# Patient Record
Sex: Female | Born: 1965 | Hispanic: No
Health system: Southern US, Community
[De-identification: ages and names within clinical notes are randomized; demographics above are authoritative.]

## PROBLEM LIST (undated history)

## (undated) ENCOUNTER — Emergency Department (HOSPITAL_COMMUNITY): Admission: EM | Payer: Self-pay | Source: Home / Self Care

## (undated) DIAGNOSIS — I2510 Atherosclerotic heart disease of native coronary artery without angina pectoris: Secondary | ICD-10-CM

## (undated) DIAGNOSIS — C539 Malignant neoplasm of cervix uteri, unspecified: Secondary | ICD-10-CM

## (undated) DIAGNOSIS — I10 Essential (primary) hypertension: Secondary | ICD-10-CM

## (undated) DIAGNOSIS — J40 Bronchitis, not specified as acute or chronic: Secondary | ICD-10-CM

## (undated) DIAGNOSIS — E119 Type 2 diabetes mellitus without complications: Secondary | ICD-10-CM

## (undated) HISTORY — DX: Atherosclerotic heart disease of native coronary artery without angina pectoris: I25.10

## (undated) HISTORY — PX: OVARY SURGERY: SHX727

---

## 2001-09-23 ENCOUNTER — Emergency Department (HOSPITAL_COMMUNITY): Admission: EM | Admit: 2001-09-23 | Discharge: 2001-09-23 | Payer: Self-pay | Admitting: Internal Medicine

## 2001-10-26 ENCOUNTER — Emergency Department (HOSPITAL_COMMUNITY): Admission: EM | Admit: 2001-10-26 | Discharge: 2001-10-26 | Payer: Self-pay | Admitting: Emergency Medicine

## 2008-08-03 ENCOUNTER — Emergency Department (HOSPITAL_COMMUNITY): Admission: EM | Admit: 2008-08-03 | Discharge: 2008-08-04 | Payer: Self-pay | Admitting: Emergency Medicine

## 2010-08-23 LAB — URINE CULTURE
Colony Count: NO GROWTH
Culture: NO GROWTH

## 2010-08-23 LAB — GC/CHLAMYDIA PROBE AMP, GENITAL
Chlamydia, DNA Probe: NEGATIVE
GC Probe Amp, Genital: NEGATIVE

## 2010-08-23 LAB — URINALYSIS, ROUTINE W REFLEX MICROSCOPIC
Ketones, ur: NEGATIVE mg/dL
Nitrite: NEGATIVE
Protein, ur: NEGATIVE mg/dL

## 2010-08-23 LAB — PREGNANCY, URINE: Preg Test, Ur: NEGATIVE

## 2011-04-25 NOTE — ED Provider Notes (Signed)
°

## 2011-04-25 NOTE — ED Notes (Signed)
°

## 2013-03-17 ENCOUNTER — Encounter (HOSPITAL_COMMUNITY): Payer: Self-pay | Admitting: Emergency Medicine

## 2013-03-17 ENCOUNTER — Emergency Department (HOSPITAL_COMMUNITY)
Admission: EM | Admit: 2013-03-17 | Discharge: 2013-03-17 | Disposition: A | Discharge status: Home / Self Care | Payer: Self-pay | Attending: Emergency Medicine | Admitting: Emergency Medicine

## 2013-03-17 DIAGNOSIS — M79672 Pain in left foot: Secondary | ICD-10-CM

## 2013-03-17 DIAGNOSIS — Z8614 Personal history of Methicillin resistant Staphylococcus aureus infection: Secondary | ICD-10-CM | POA: Insufficient documentation

## 2013-03-17 DIAGNOSIS — F17200 Nicotine dependence, unspecified, uncomplicated: Secondary | ICD-10-CM | POA: Insufficient documentation

## 2013-03-17 DIAGNOSIS — N764 Abscess of vulva: Secondary | ICD-10-CM | POA: Insufficient documentation

## 2013-03-17 DIAGNOSIS — M79609 Pain in unspecified limb: Secondary | ICD-10-CM | POA: Insufficient documentation

## 2013-03-17 MED ORDER — LIDOCAINE HCL (PF) 1 % IJ SOLN
5.0000 mL | Freq: Once | INTRAMUSCULAR | Status: AC
  Administered 2013-03-17: 5 mL via INTRADERMAL
  Filled 2013-03-17: qty 5

## 2013-03-17 MED ORDER — HYDROCODONE-ACETAMINOPHEN 5-325 MG PO TABS: ORAL_TABLET | ORAL | Status: DC

## 2013-03-17 MED ORDER — OXYCODONE-ACETAMINOPHEN 5-325 MG PO TABS
1.0000 | ORAL_TABLET | Freq: Once | ORAL | Status: AC
  Administered 2013-03-17: 1 via ORAL
  Filled 2013-03-17: qty 1

## 2013-03-17 MED ORDER — SULFAMETHOXAZOLE-TRIMETHOPRIM 800-160 MG PO TABS: 1.0000 | ORAL_TABLET | Freq: Two times a day (BID) | ORAL | Status: DC

## 2013-03-17 NOTE — ED Provider Notes (Signed)
Medical screening examination/treatment/procedure(s) were performed by non-physician practitioner and as supervising physician I was immediately available for consultation/collaboration.  EKG Interpretation   None         Charles B. Bernette Mayers, MD 03/17/13 [phone]

## 2013-03-17 NOTE — ED Provider Notes (Signed)
CSN: [id number]     Arrival date & time 03/17/13  1346 History   First MD Initiated Contact with Patient 03/17/13 1358     Chief Complaint  Patient presents with  . Wound Infection  . Foot Pain   (Consider location/radiation/quality/duration/timing/severity/associated sxs/prior Treatment) Patient is a 47 y.o. female presenting with lower extremity pain and abscess. The history is provided by the patient.  Foot Pain This is a new problem. Episode onset: 2 weeks. The problem occurs constantly. The problem has been unchanged. Associated symptoms include arthralgias. Pertinent negatives include no abdominal pain, chills, fever, joint swelling, nausea, neck pain, numbness, rash, vomiting or weakness. Associated symptoms comments: Left foot. The symptoms are aggravated by walking. She has tried nothing for the symptoms. The treatment provided no relief.  Abscess Location:  Ano-genital Ano-genital abscess location:  Vagina Size:  3 Abscess quality: fluctuance, induration and painful   Abscess quality: not draining, no redness and not weeping   Red streaking: no   Duration:  4 days Progression:  Worsening Pain details:    Quality:  Throbbing   Severity:  Moderate   Timing:  Constant   Progression:  Worsening Chronicity:  Recurrent Context: not diabetes   Context comment:  Hx of MRSA Relieved by:  Nothing Worsened by:  Nothing tried Ineffective treatments:  None tried Associated symptoms: no fever, no nausea and no vomiting   Risk factors: hx of MRSA and prior abscess     History reviewed. No pertinent past medical history. History reviewed. No pertinent past surgical history. No family history on file. History  Substance Use Topics  . Smoking status: Current Every Day Smoker    Types: Cigarettes  . Smokeless tobacco: Not on file  . Alcohol Use: Yes     Comment: occ   OB History   Grav Para Term Preterm Abortions TAB SAB Ect Mult Living                 Review of Systems   Constitutional: Negative for fever and chills.  Gastrointestinal: Negative for nausea, vomiting and abdominal pain.  Musculoskeletal: Positive for arthralgias. Negative for joint swelling and neck pain.       Left foot pain  Skin: Positive for color change. Negative for rash.       Abscess of the labia  Neurological: Negative for weakness and numbness.  Hematological: Negative for adenopathy.  All other systems reviewed and are negative.    Allergies  Review of patient's allergies indicates no known allergies.  Home Medications   Current Outpatient Rx  Name  Route  Sig  Dispense  Refill  . diphenhydramine-acetaminophen (TYLENOL PM) 25-500 MG TABS   Oral   Take 1 tablet by mouth at bedtime as needed (sleep).         . ranitidine (ZANTAC) 150 MG tablet   Oral   Take 150 mg by mouth daily as needed for heartburn.          BP 144/80  Pulse 103  Temp(Src) 98.4 F (36.9 C) (Oral)  Resp 17  Ht 5' (1.524 m)  Wt 218 lb (98.884 kg)  BMI 42.58 kg/m2  SpO2 100%  LMP 03/12/2013 Physical Exam  Nursing note and vitals reviewed. Constitutional: She is oriented to person, place, and time. She appears well-developed and well-nourished. No distress.  HENT:  Head: Normocephalic and atraumatic.  Cardiovascular: Normal rate, regular rhythm and normal heart sounds.   No murmur heard. Pulmonary/Chest: Effort normal and breath sounds normal.  No respiratory distress.  Musculoskeletal: She exhibits tenderness.  Localized ttp of the dorsal left foot.  No edema, erythema or bruising.  Pt has full ROM of the left foot and ankle.  No proximal tenderness.  NV intact  Neurological: She is alert and oriented to person, place, and time. She exhibits normal muscle tone. Coordination normal.  Skin: Skin is warm and dry. There is erythema.  Abscess to the right labia.  Moderate induration and fluctuance.  No drainage. No abscess of the Bartholin's gland    ED Course  Procedures (including  critical care time) Labs Review Labs Reviewed - No data to display Imaging Review No results found.  EKG Interpretation   None       MDM    INCISION AND DRAINAGE Performed by: Pauline Aus L. Consent: Verbal consent obtained. Risks and benefits: risks, benefits and alternatives were discussed Type: abscess  Body area: right labia  Anesthesia: local infiltration  Incision was made with a #11 scalpel.  Local anesthetic: lidocaine 1 % w/o epinephrine  Anesthetic total: 3 ml  Complexity: complex Blunt dissection to break up loculations  Drainage: purulent  Drainage amount: moderate  Packing material: 1/4 in iodoform gauze  Patient tolerance: Patient tolerated the procedure well with no immediate complications.     Patient is non-toxic appearing.  Ambulates with steady gait.  Agrees to warm wet compresses, Bactrim, #12 Vicodin for pain. Packing removal in 2 days. Agrees to return here if needed. Patient appears stable for discharge.  Junaid Wurzer L. Trisha Mangle, PA-C 03/17/13 1610

## 2013-03-17 NOTE — ED Notes (Signed)
1. Pt reports ?abcess to vaginal area since Saturday, no fever, area is not draining, no nausea or vomiting.  2. Left foot pain for 2 weeks, denies any known injury, "really hurts to walk" Drinking tea in triage.

## 2013-07-31 ENCOUNTER — Emergency Department: Payer: Self-pay | Admitting: Internal Medicine

## 2014-11-30 ENCOUNTER — Emergency Department (HOSPITAL_COMMUNITY)
Admission: EM | Admit: 2014-11-30 | Discharge: 2014-11-30 | Disposition: A | Discharge status: Home / Self Care | Payer: Self-pay | Attending: Emergency Medicine | Admitting: Emergency Medicine

## 2014-11-30 ENCOUNTER — Emergency Department (HOSPITAL_COMMUNITY): Payer: Self-pay

## 2014-11-30 ENCOUNTER — Encounter (HOSPITAL_COMMUNITY): Payer: Self-pay | Admitting: Emergency Medicine

## 2014-11-30 DIAGNOSIS — Y9289 Other specified places as the place of occurrence of the external cause: Secondary | ICD-10-CM | POA: Insufficient documentation

## 2014-11-30 DIAGNOSIS — S39012A Strain of muscle, fascia and tendon of lower back, initial encounter: Secondary | ICD-10-CM | POA: Insufficient documentation

## 2014-11-30 DIAGNOSIS — Y998 Other external cause status: Secondary | ICD-10-CM | POA: Insufficient documentation

## 2014-11-30 DIAGNOSIS — W228XXA Striking against or struck by other objects, initial encounter: Secondary | ICD-10-CM | POA: Insufficient documentation

## 2014-11-30 DIAGNOSIS — Z720 Tobacco use: Secondary | ICD-10-CM | POA: Insufficient documentation

## 2014-11-30 DIAGNOSIS — S92911A Unspecified fracture of right toe(s), initial encounter for closed fracture: Secondary | ICD-10-CM

## 2014-11-30 DIAGNOSIS — Z792 Long term (current) use of antibiotics: Secondary | ICD-10-CM | POA: Insufficient documentation

## 2014-11-30 DIAGNOSIS — S92511A Displaced fracture of proximal phalanx of right lesser toe(s), initial encounter for closed fracture: Secondary | ICD-10-CM | POA: Insufficient documentation

## 2014-11-30 DIAGNOSIS — Y9311 Activity, swimming: Secondary | ICD-10-CM | POA: Insufficient documentation

## 2014-11-30 MED ORDER — CYCLOBENZAPRINE HCL 10 MG PO TABS: 10.0000 mg | ORAL_TABLET | Freq: Three times a day (TID) | ORAL | Status: DC | PRN

## 2014-11-30 MED ORDER — IBUPROFEN 800 MG PO TABS: 800.0000 mg | ORAL_TABLET | Freq: Three times a day (TID) | ORAL | Status: DC

## 2014-11-30 MED ORDER — TRAMADOL HCL 50 MG PO TABS: 50.0000 mg | ORAL_TABLET | Freq: Four times a day (QID) | ORAL | Status: DC | PRN

## 2014-11-30 NOTE — ED Notes (Signed)
Pt states that she has chronic back pain and this morning twisted at work and now it is hurting worse.  Also c/o right 2nd toe pain x 1 month.

## 2014-11-30 NOTE — ED Notes (Signed)
Patient with no complaints at this time. Respirations even and unlabored. Skin warm/dry. Discharge instructions reviewed with patient at this time. Patient given opportunity to voice concerns/ask questions. Patient discharged at this time and left Emergency Department with steady gait.   

## 2014-11-30 NOTE — ED Notes (Signed)
PA Tammy Triplett at bedside.

## 2014-11-30 NOTE — ED Provider Notes (Signed)
CSN: [id number]     Arrival date & time 11/30/14  6295 History   First MD Initiated Contact with Patient 11/30/14 [phone]     Chief Complaint  Patient presents with  . Back Pain     (Consider location/radiation/quality/duration/timing/severity/associated sxs/prior Treatment) HPI   Laura Frost is a 49 y.o. female who presents to the Emergency Department complaining of sudden onset of right sided low back pain that began this morning while at work.  She reports twisting to her left and felt a "pulling" sensation to her right back.  She states this is a reoccurring problem.  Pain to her back is worse with movement and improves with rest.  She has not taken any medications prior to arrival.  She denies numbness, pain or weakness to the lower extremities, urine or bowel changes or abdominal pain.    She also request evaluation of pain and swelling to her right second toe.  She reports a direct blow to her toe one month ago while swimming. She states she continues to have swelling to her toe and pain with weight bearing.  She denies pain or swelling proximal to the toe.     History reviewed. No pertinent past medical history. History reviewed. No pertinent past surgical history. History reviewed. No pertinent family history. History  Substance Use Topics  . Smoking status: Current Every Day Smoker    Types: Cigarettes  . Smokeless tobacco: Not on file  . Alcohol Use: Yes     Comment: occ   OB History    No data available     Review of Systems  Constitutional: Negative for fever.  Respiratory: Negative for shortness of breath.   Gastrointestinal: Negative for vomiting, abdominal pain and constipation.  Genitourinary: Negative for dysuria, hematuria, flank pain, decreased urine volume and difficulty urinating.  Musculoskeletal: Positive for back pain and arthralgias (right second toe pain and swelling). Negative for joint swelling.  Skin: Negative for rash and wound.  Neurological:  Negative for weakness and numbness.  All other systems reviewed and are negative.     Allergies  Review of patient's allergies indicates no known allergies.  Home Medications   Prior to Admission medications   Medication Sig Start Date End Date Taking? Authorizing Provider  diphenhydramine-acetaminophen (TYLENOL PM) 25-500 MG TABS Take 1 tablet by mouth at bedtime as needed (sleep).    Historical Provider, MD  HYDROcodone-acetaminophen (NORCO/VICODIN) 5-325 MG per tablet Take one-two tabs po q 4-6 hrs prn pain 03/17/13   Donte Lenzo, PA-C  ranitidine (ZANTAC) 150 MG tablet Take 150 mg by mouth daily as needed for heartburn.    Historical Provider, MD  sulfamethoxazole-trimethoprim (SEPTRA DS) 800-160 MG per tablet Take 1 tablet by mouth 2 (two) times daily. 03/17/13   Alejos Reinhardt, PA-C   BP 151/82 mmHg  Pulse 80  Temp(Src) 98.1 F (36.7 C) (Oral)  Resp 18  Ht 5' (1.524 m)  Wt 215 lb (97.523 kg)  BMI 41.99 kg/m2  SpO2 100%  LMP 11/17/2014 Physical Exam  Constitutional: She is oriented to person, place, and time. She appears well-developed and well-nourished. No distress.  HENT:  Head: Normocephalic and atraumatic.  Neck: Normal range of motion. Neck supple.  Cardiovascular: Normal rate, regular rhythm, normal heart sounds and intact distal pulses.   No murmur heard. Pulmonary/Chest: Effort normal and breath sounds normal. No respiratory distress.  Abdominal: Soft. She exhibits no distension. There is no tenderness.  Musculoskeletal: She exhibits tenderness. She exhibits no edema.  Lumbar back: She exhibits tenderness and pain. She exhibits normal range of motion, no swelling, no deformity, no laceration and normal pulse.  ttp of the right lumbar paraspinal muscles.  No spinal tenderness.  DP pulses are brisk and symmetrical.  Distal sensation intact.  Hip Flexors/Extensors are intact.  Pt has 5/5 strength against resistance of bilateral lower extremities.  ttp at the  base of the right second toe.  Moderate edema.  No discoloration, distal sensation intact.     Neurological: She is alert and oriented to person, place, and time. She has normal strength. No sensory deficit. She exhibits normal muscle tone. Coordination and gait normal.  Reflex Scores:      Patellar reflexes are 2+ on the right side and 2+ on the left side.      Achilles reflexes are 2+ on the right side and 2+ on the left side. Skin: Skin is warm and dry. No rash noted.  Nursing note and vitals reviewed.   ED Course  Procedures (including critical care time) Labs Review Labs Reviewed - No data to display  Imaging Review Dg Toe 2nd Right  11/30/2014   CLINICAL DATA:  49 year old female with a history of second right toe pain. Trauma occurred 1-2 months ago.  EXAM: RIGTH SECOND TOE  COMPARISON:  None.  FINDINGS: Fracture site of the proximal phalanx of the second toe with callus formation and incomplete healing. No significant displacement. No additional fractures identified.  IMPRESSION: Fracture site of the proximal phalanx of the second right toe, with callus formation and incomplete healing. No displacement.  Signed,  Dulcy Fanny. Earleen Newport, DO  [facility]  [facility]   Electronically Signed   By: Corrie Mckusick D.O.   On: 11/30/2014 10:32     EKG Interpretation None      MDM   Final diagnoses:  Lumbar strain, initial encounter  Toe fracture, right, closed, initial encounter    Back pain is likely muscular.  Pt is ambulatory.  No concerning sx's for emergent neurological or infectious process.  Pt has a subacute fx of toe.  Remains NV intact.  Toe buddy taped , she agrees to f/u with ortho    Kem Parkinson, PA-C 12/01/14 2123  Jola Schmidt, MD 12/05/14 [phone]

## 2014-11-30 NOTE — Discharge Instructions (Signed)
Buddy Taping of Toes We have taped your toes together to keep them from moving. This is called "buddy taping" since we used a part of your own body to keep the injured part still. We placed soft padding between your toes to keep them from rubbing against each other. Buddy taping will help with healing and to reduce pain. Keep your toes buddy taped together for as long as directed by your caregiver. HOME CARE INSTRUCTIONS   Raise your injured area above the level of your heart while sitting or lying down. Prop it up with pillows.  An ice pack used every twenty minutes, while awake, for the first one to two days may be helpful. Put ice in a plastic bag and put a towel between the bag and your skin.  Watch for signs that the taping is too tight. These signs may be:  Numbness of your taped toes.  Coolness of your taped toes.  Color change in the area beyond the tape.  Increased pain.  If you have any of these signs, loosen or rewrap the tape. If you need to loosen or rewrap the buddy tape, make sure you use the padding again. SEEK IMMEDIATE MEDICAL CARE IF:   You have worse pain, swelling, inflammation (soreness), drainage or bleeding after you rewrap the tape.  Any new problems occur. MAKE SURE YOU:   Understand these instructions.  Will watch your condition.  Will get help right away if you are not doing well or get worse. Document Released: 02/01/2004 Document Revised: 07/22/2011 Document Reviewed: 04/26/2008 [facility] Patient Information 2015 [place], [place]. This information is not intended to replace advice given to you by your health care provider. Make sure you discuss any questions you have with your health care provider.  Lumbosacral Strain Lumbosacral strain is a strain of any of the parts that make up your lumbosacral vertebrae. Your lumbosacral vertebrae are the bones that make up the lower third of your backbone. Your lumbosacral vertebrae are held together by muscles and  tough, fibrous tissue (ligaments).  CAUSES  A sudden blow to your back can cause lumbosacral strain. Also, anything that causes an excessive stretch of the muscles in the low back can cause this strain. This is typically seen when people exert themselves strenuously, fall, lift heavy objects, bend, or crouch repeatedly. RISK FACTORS  Physically demanding work.  Participation in pushing or pulling sports or sports that require a sudden twist of the back (tennis, golf, baseball).  Weight lifting.  Excessive lower back curvature.  Forward-tilted pelvis.  Weak back or abdominal muscles or both.  Tight hamstrings. SIGNS AND SYMPTOMS  Lumbosacral strain may cause pain in the area of your injury or pain that moves (radiates) down your leg.  DIAGNOSIS Your health care provider can often diagnose lumbosacral strain through a physical exam. In some cases, you may need tests such as X-ray exams.  TREATMENT  Treatment for your lower back injury depends on many factors that your clinician will have to evaluate. However, most treatment will include the use of anti-inflammatory medicines. HOME CARE INSTRUCTIONS   Avoid hard physical activities (tennis, racquetball, waterskiing) if you are not in proper physical condition for it. This may aggravate or create problems.  If you have a back problem, avoid sports requiring sudden body movements. Swimming and walking are generally safer activities.  Maintain good posture.  Maintain a healthy weight.  For acute conditions, you may put ice on the injured area.  Put ice in a plastic  bag.  Place a towel between your skin and the bag.  Leave the ice on for 20 minutes, 2-3 times a day.  When the low back starts healing, stretching and strengthening exercises may be recommended. SEEK MEDICAL CARE IF:  Your back pain is getting worse.  You experience severe back pain not relieved with medicines. SEEK IMMEDIATE MEDICAL CARE IF:   You have  numbness, tingling, weakness, or problems with the use of your arms or legs.  There is a change in bowel or bladder control.  You have increasing pain in any area of the body, including your belly (abdomen).  You notice shortness of breath, dizziness, or feel faint.  You feel sick to your stomach (nauseous), are throwing up (vomiting), or become sweaty.  You notice discoloration of your toes or legs, or your feet get very cold. MAKE SURE YOU:   Understand these instructions.  Will watch your condition.  Will get help right away if you are not doing well or get worse. Document Released: 02/06/2005 Document Revised: 05/04/2013 Document Reviewed: 12/16/2012 [facility] Patient Information 2015 [place], [place]. This information is not intended to replace advice given to you by your health care provider. Make sure you discuss any questions you have with your health care provider.

## 2016-02-05 ENCOUNTER — Emergency Department (HOSPITAL_COMMUNITY)
Admission: EM | Admit: 2016-02-05 | Discharge: 2016-02-05 | Disposition: A | Discharge status: Home / Self Care | Payer: Self-pay | Attending: Emergency Medicine | Admitting: Emergency Medicine

## 2016-02-05 ENCOUNTER — Emergency Department (HOSPITAL_COMMUNITY): Payer: Self-pay

## 2016-02-05 ENCOUNTER — Encounter (HOSPITAL_COMMUNITY): Payer: Self-pay | Admitting: Emergency Medicine

## 2016-02-05 DIAGNOSIS — J069 Acute upper respiratory infection, unspecified: Secondary | ICD-10-CM | POA: Insufficient documentation

## 2016-02-05 DIAGNOSIS — Z79899 Other long term (current) drug therapy: Secondary | ICD-10-CM | POA: Insufficient documentation

## 2016-02-05 DIAGNOSIS — F17210 Nicotine dependence, cigarettes, uncomplicated: Secondary | ICD-10-CM | POA: Insufficient documentation

## 2016-02-05 DIAGNOSIS — Z791 Long term (current) use of non-steroidal anti-inflammatories (NSAID): Secondary | ICD-10-CM | POA: Insufficient documentation

## 2016-02-05 MED ORDER — PREDNISONE 20 MG PO TABS: 40.0000 mg | ORAL_TABLET | Freq: Every day | ORAL | 0 refills | Status: DC

## 2016-02-05 MED ORDER — GUAIFENESIN-CODEINE 100-10 MG/5ML PO SYRP: 10.0000 mL | ORAL_SOLUTION | Freq: Three times a day (TID) | ORAL | 0 refills | Status: DC | PRN

## 2016-02-05 MED ORDER — PREDNISONE 50 MG PO TABS
60.0000 mg | ORAL_TABLET | Freq: Once | ORAL | Status: AC
  Administered 2016-02-05: 60 mg via ORAL
  Filled 2016-02-05: qty 1

## 2016-02-05 MED ORDER — ALBUTEROL SULFATE HFA 108 (90 BASE) MCG/ACT IN AERS
2.0000 | INHALATION_SPRAY | Freq: Once | RESPIRATORY_TRACT | Status: AC
  Administered 2016-02-05: 2 via RESPIRATORY_TRACT
  Filled 2016-02-05: qty 6.7

## 2016-02-05 MED ORDER — GUAIFENESIN-CODEINE 100-10 MG/5ML PO SOLN
10.0000 mL | Freq: Once | ORAL | Status: AC
  Administered 2016-02-05: 10 mL via ORAL
  Filled 2016-02-05: qty 10

## 2016-02-05 NOTE — ED Triage Notes (Signed)
PT c/o right ear pressure, nasal congestion and cough x1 week unrelieved by home robitussin medication. PT denies any fevers or SOB.

## 2016-02-05 NOTE — ED Provider Notes (Signed)
[facility] DEPT Provider Note   CSN: [id number] Arrival date & time: 02/05/16  [id number]     History   Chief Complaint Chief Complaint  Patient presents with  . Cough    HPI Laura Frost is a 50 y.o. female.  HPI  Laura Frost is a 50 y.o. female who presents to the Emergency Department complaining of Persistent nonproductive cough for one week. She also reports associated nasal congestion, sore throat and drainage. She states the cough is worse when lying supine.  She does report recent sick contacts. She's been using over-the-counter Robitussin with some relief. She denies any fevers, chills, shortness of breath, and chest pain. Patient does continue to smoke.   History reviewed. No pertinent past medical history.  There are no active problems to display for this patient.   History reviewed. No pertinent surgical history.  OB History    No data available       Home Medications    Prior to Admission medications   Medication Sig Start Date End Date Taking? Authorizing Provider  cyclobenzaprine (FLEXERIL) 10 MG tablet Take 1 tablet (10 mg total) by mouth 3 (three) times daily as needed. 11/30/14   Zhanna Melin, PA-C  diphenhydramine-acetaminophen (TYLENOL PM) 25-500 MG TABS Take 1 tablet by mouth at bedtime as needed (sleep).    Historical Provider, MD  HYDROcodone-acetaminophen (NORCO/VICODIN) 5-325 MG per tablet Take one-two tabs po q 4-6 hrs prn pain 03/17/13   Kaidyn Hernandes, PA-C  ibuprofen (ADVIL,MOTRIN) 800 MG tablet Take 1 tablet (800 mg total) by mouth 3 (three) times daily. 11/30/14   Kellsey Sansone, PA-C  ranitidine (ZANTAC) 150 MG tablet Take 150 mg by mouth daily as needed for heartburn.    Historical Provider, MD  sulfamethoxazole-trimethoprim (SEPTRA DS) 800-160 MG per tablet Take 1 tablet by mouth 2 (two) times daily. 03/17/13   Kanani Mowbray, PA-C  traMADol (ULTRAM) 50 MG tablet Take 1 tablet (50 mg total) by mouth every 6 (six) hours as needed. 11/30/14    Elizar Alpern, PA-C    Family History History reviewed. No pertinent family history.  Social History Social History  Substance Use Topics  . Smoking status: Current Every Day Smoker    Types: Cigarettes  . Smokeless tobacco: Never Used  . Alcohol use Yes     Comment: occ     Allergies   Review of patient's allergies indicates no known allergies.   Review of Systems Review of Systems  Constitutional: Negative for activity change, appetite change, chills and fever.  HENT: Positive for congestion, rhinorrhea and sore throat. Negative for facial swelling and trouble swallowing.   Eyes: Negative for visual disturbance.  Respiratory: Positive for cough and wheezing. Negative for shortness of breath and stridor.   Cardiovascular: Negative for chest pain.  Gastrointestinal: Negative for abdominal pain, nausea and vomiting.  Musculoskeletal: Negative for neck pain and neck stiffness.  Skin: Negative.   Neurological: Negative for dizziness, weakness, numbness and headaches.  Hematological: Negative for adenopathy.  Psychiatric/Behavioral: Negative for confusion.  All other systems reviewed and are negative.    Physical Exam Updated Vital Signs BP 166/97 (BP Location: Left Arm)   Pulse 86   Temp 98 F (36.7 C) (Oral)   Resp 22   Ht 5' (1.524 m)   Wt 77.1 kg   LMP 01/29/2016   SpO2 98%   BMI 33.20 kg/m   Physical Exam  Constitutional: She is oriented to person, place, and time. She appears well-developed and well-nourished. No distress.  HENT:  Head: Normocephalic and atraumatic.  Right Ear: Tympanic membrane and ear canal normal.  Left Ear: Tympanic membrane and ear canal normal.  Mouth/Throat: Uvula is midline and mucous membranes are normal. Posterior oropharyngeal erythema present. No oropharyngeal exudate, posterior oropharyngeal edema or tonsillar abscesses.  Eyes: EOM are normal. Pupils are equal, round, and reactive to light.  Neck: Normal range of motion,  full passive range of motion without pain and phonation normal. Neck supple.  Cardiovascular: Normal rate, regular rhythm and intact distal pulses.   No murmur heard. Pulmonary/Chest: Effort normal. No stridor. No respiratory distress. She has wheezes. She has no rales. She exhibits no tenderness.  Few scattered expiratory wheezes on exam. No rales or rhonchi.  Abdominal: Soft. She exhibits no distension. There is no tenderness.  Musculoskeletal: She exhibits no edema.  Lymphadenopathy:    She has no cervical adenopathy.  Neurological: She is alert and oriented to person, place, and time. She exhibits normal muscle tone. Coordination normal.  Skin: Skin is warm and dry.  Nursing note and vitals reviewed.    ED Treatments / Results  Labs (all labs ordered are listed, but only abnormal results are displayed) Labs Reviewed - No data to display  EKG  EKG Interpretation None       Radiology Dg Chest 2 View  Result Date: 02/05/2016 CLINICAL DATA:  50 year old female with cough for 2 weeks, initially productive but now nonproductive. Initial encounter. Smoker. EXAM: CHEST  2 VIEW COMPARISON:  None. FINDINGS: Lung volumes are within normal limits. Normal cardiac size and mediastinal contours. Visualized tracheal air column is within normal limits. Mild diffuse increased interstitial markings. No pneumothorax, pulmonary edema, pleural effusion or confluent pulmonary opacity. No acute osseous abnormality identified. Mild dextro convex scoliotic curvature of the thoracic spine. IMPRESSION: Mildly increased bilateral pulmonary interstitial markings likely related to smoking. Viral/atypical respiratory infection would be difficult to exclude. Otherwise no acute cardiopulmonary abnormality. Electronically Signed   By: Genevie Ann M.D.   On: 02/05/2016 09:24    Procedures Procedures (including critical care time)  Medications Ordered in ED Medications  albuterol (PROVENTIL HFA;VENTOLIN HFA) 108  (90 Base) MCG/ACT inhaler 2 puff (not administered)  predniSONE (DELTASONE) tablet 60 mg (not administered)  guaiFENesin-codeine 100-10 MG/5ML solution 10 mL (not administered)     Initial Impression / Assessment and Plan / ED Course  I have reviewed the triage vital signs and the nursing notes.  Pertinent labs & imaging results that were available during my care of the patient were reviewed by me and considered in my medical decision making (see chart for details).  Clinical Course    Patient well appearing. Vital signs are stable. No hypoxia or tachypnea. Symptoms are likely related to a viral URI.  Albuterol inhaler dispensed. Patient agrees to symptomatic treatment and follow-up with her PCP if needed.  Final Clinical Impressions(s) / ED Diagnoses   Final diagnoses:  URI (upper respiratory infection)    New Prescriptions New Prescriptions   No medications on file     Kem Parkinson, PA-C 02/05/16 [place], MD 02/10/16 1446

## 2016-02-05 NOTE — Discharge Instructions (Signed)
2 puffs of the inhaler 4 times a day as needed. Drink plenty of fluids. Tylenol if needed for fever. Follow-up with your doctor or return to the ER for any worsening symptoms.

## 2016-02-14 ENCOUNTER — Emergency Department (HOSPITAL_COMMUNITY)
Admission: EM | Admit: 2016-02-14 | Discharge: 2016-02-14 | Disposition: A | Discharge status: Home / Self Care | Payer: Self-pay | Attending: Emergency Medicine | Admitting: Emergency Medicine

## 2016-02-14 ENCOUNTER — Encounter (HOSPITAL_COMMUNITY): Payer: Self-pay | Admitting: Emergency Medicine

## 2016-02-14 DIAGNOSIS — Z79899 Other long term (current) drug therapy: Secondary | ICD-10-CM | POA: Insufficient documentation

## 2016-02-14 DIAGNOSIS — Z792 Long term (current) use of antibiotics: Secondary | ICD-10-CM | POA: Insufficient documentation

## 2016-02-14 DIAGNOSIS — H66001 Acute suppurative otitis media without spontaneous rupture of ear drum, right ear: Secondary | ICD-10-CM | POA: Insufficient documentation

## 2016-02-14 DIAGNOSIS — F17210 Nicotine dependence, cigarettes, uncomplicated: Secondary | ICD-10-CM | POA: Insufficient documentation

## 2016-02-14 MED ORDER — AMOXICILLIN 500 MG PO CAPS: 500.0000 mg | ORAL_CAPSULE | Freq: Two times a day (BID) | ORAL | 0 refills | Status: DC

## 2016-02-14 NOTE — ED Notes (Signed)
Pt made aware to return if symptoms worsen or if any life threatening symptoms occur.   

## 2016-02-14 NOTE — ED Triage Notes (Signed)
Pt reports right ear pain x 2 weeks. Pt states she thinks she may have a q-tip swab stuck in right ear.

## 2016-02-14 NOTE — ED Provider Notes (Signed)
[facility] DEPT Provider Note   CSN: [id number] Arrival date & time: 02/14/16  1219     History   Chief Complaint Chief Complaint  Patient presents with  . Foreign Body in [place] is a 50 y.o. female.  HPI  She feels that her ear is "stopped up" for the past two weeks. She has used Q tips in her ears but is uncertain if any parts were left behind. No drainage. No otalgia. No fevers or chills. She had a respiratory infection 3 weeks ago that is resolving - had chest congestion with that.   History reviewed. No pertinent past medical history.  History reviewed. No pertinent surgical history.   Home Medications    Prior to Admission medications   Medication Sig Start Date End Date Taking? Authorizing Provider  albuterol (PROVENTIL HFA;VENTOLIN HFA) 108 (90 Base) MCG/ACT inhaler Inhale 1-2 puffs into the lungs every 6 (six) hours as needed for wheezing or shortness of breath.   Yes Historical Provider, MD  amoxicillin (AMOXIL) 500 MG capsule Take 1 capsule (500 mg total) by mouth 2 (two) times daily. 02/14/16   Milagros Loll, MD    Family History History reviewed. No pertinent family history.  Social History Social History  Substance Use Topics  . Smoking status: Current Every Day Smoker    Types: Cigarettes  . Smokeless tobacco: Never Used  . Alcohol use Yes     Comment: occ     Allergies   Asa [aspirin]   Review of Systems Review of Systems Constitutional: no fevers/chills Eyes: no vision changes Ears, nose, mouth, throat, and face: +cough productive of white phlegm Respiratory: no shortness of breath Cardiovascular: no chest pain Gastrointestinal: no nausea/vomiting, no abdominal pain, no constipation, no diarrhea Genitourinary: no dysuria, no hematuria Integument: no rash Hematologic/lymphatic: no bleeding/bruising, no edema Musculoskeletal: no arthralgias, no myalgias Neurological: no paresthesias, no weakness   Physical  Exam Updated Vital Signs BP 146/88   Pulse 73   Temp 97.8 F (36.6 C) (Oral)   Resp 16   Ht 5' (1.524 m)   Wt 79.8 kg   LMP 01/29/2016   SpO2 95%   BMI 34.37 kg/m   Physical Exam General Apperance: NAD Head: Normocephalic, atraumatic Eyes: PERRL, EOMI, anicteric sclera Ears: Normal external ear canal, R TM with white discoloration and mild bulging and erythema Nose: Nares normal, septum midline, mucosa normal Throat: Lips, mucosa and tongue normal  Neck: Supple, trachea midline Back: No tenderness or bony abnormality  Lungs: Clear to auscultation bilaterally. No wheezes, rhonchi or rales. Breathing comfortably Chest Wall: Nontender, no deformity Heart: Regular rate and rhythm, no murmur/rub/gallop Abdomen: Soft, nontender, nondistended, no rebound/guarding Extremities: Normal, atraumatic, warm and well perfused, no edema Pulses: 2+ throughout Skin: No rashes or lesions Neurologic: Alert and oriented x 3. CNII-XII intact. Normal strength and sensation   ED Treatments / Results  Procedures   Medications Ordered in ED Medications - No data to display   Initial Impression / Assessment and Plan / ED Course  I have reviewed the triage vital signs and the nursing notes.  Pertinent labs & imaging results that were available during my care of the patient were reviewed by me and considered in my medical decision making (see chart for details).  Clinical Course  12:50pm Evaluated and no foreign body seen in R ear canal.   Final Clinical Impressions(s) / ED Diagnoses   Final diagnoses:  Acute suppurative otitis media of right  ear without spontaneous rupture of tympanic membrane, recurrence not specified  Exam consistent with acute otitis media of the R ear. Will treat with amoxicillin 500mg  BID for 5 day course. Return precautions discussed. Follow up with PCP in 1-2 weeks.   New Prescriptions New Prescriptions   AMOXICILLIN (AMOXIL) 500 MG CAPSULE    Take 1 capsule (500  mg total) by mouth 2 (two) times daily.     Milagros Loll, MD 02/14/16 [place], MD 02/14/16 [phone]

## 2016-02-16 ENCOUNTER — Encounter (HOSPITAL_COMMUNITY): Payer: Self-pay | Admitting: Emergency Medicine

## 2016-02-16 ENCOUNTER — Emergency Department (HOSPITAL_COMMUNITY): Payer: Self-pay

## 2016-02-16 ENCOUNTER — Emergency Department (HOSPITAL_COMMUNITY)
Admission: EM | Admit: 2016-02-16 | Discharge: 2016-02-16 | Disposition: A | Discharge status: Home / Self Care | Payer: Self-pay | Attending: Emergency Medicine | Admitting: Emergency Medicine

## 2016-02-16 DIAGNOSIS — Y9301 Activity, walking, marching and hiking: Secondary | ICD-10-CM | POA: Insufficient documentation

## 2016-02-16 DIAGNOSIS — Z792 Long term (current) use of antibiotics: Secondary | ICD-10-CM | POA: Insufficient documentation

## 2016-02-16 DIAGNOSIS — Y999 Unspecified external cause status: Secondary | ICD-10-CM | POA: Insufficient documentation

## 2016-02-16 DIAGNOSIS — S93402A Sprain of unspecified ligament of left ankle, initial encounter: Secondary | ICD-10-CM | POA: Insufficient documentation

## 2016-02-16 DIAGNOSIS — W109XXA Fall (on) (from) unspecified stairs and steps, initial encounter: Secondary | ICD-10-CM | POA: Insufficient documentation

## 2016-02-16 DIAGNOSIS — Z79899 Other long term (current) drug therapy: Secondary | ICD-10-CM | POA: Insufficient documentation

## 2016-02-16 DIAGNOSIS — Y929 Unspecified place or not applicable: Secondary | ICD-10-CM | POA: Insufficient documentation

## 2016-02-16 DIAGNOSIS — F17210 Nicotine dependence, cigarettes, uncomplicated: Secondary | ICD-10-CM | POA: Insufficient documentation

## 2016-02-16 HISTORY — DX: Bronchitis, not specified as acute or chronic: J40

## 2016-02-16 MED ORDER — OXYCODONE-ACETAMINOPHEN 5-325 MG PO TABS
1.0000 | ORAL_TABLET | Freq: Once | ORAL | Status: AC
  Administered 2016-02-16: 1 via ORAL
  Filled 2016-02-16: qty 1

## 2016-02-16 MED ORDER — IBUPROFEN 400 MG PO TABS
600.0000 mg | ORAL_TABLET | Freq: Once | ORAL | Status: AC
  Administered 2016-02-16: 600 mg via ORAL
  Filled 2016-02-16: qty 2

## 2016-02-16 NOTE — ED Notes (Signed)
EDP at bedside  

## 2016-02-16 NOTE — ED Provider Notes (Signed)
[facility] DEPT Provider Note   CSN: [id number] Arrival date & time: 02/16/16  1001  By signing my name below, I, Laura Frost, attest that this documentation has been prepared under the direction and in the presence of Laura Manifold, MD . Electronically Signed: Higinio Frost, Scribe. 02/16/2016. 10:59 AM.  History   Chief Complaint Chief Complaint  Patient presents with  . Ankle Injury   The history is provided by the patient. No language interpreter was used.   HPI Comments: Laura Frost is a 50 y.o. female who presents to the Emergency Department complaining of left ankle pain s/p a fall that occurred this morning. Pt reports she was walking up the steps this morning when she slipped and "rolled" her ankle. She states associated pain when ambulating and left ankle swelling. She notes she has not taken any medication to relieve her pain. Pt denies pain to any other area.    Past Medical History:  Diagnosis Date  . Bronchitis    There are no active problems to display for this patient.  History reviewed. No pertinent surgical history.  OB History    Gravida Para Term Preterm AB Living   1         1   SAB TAB Ectopic Multiple Live Births                 Home Medications    Prior to Admission medications   Medication Sig Start Date End Date Taking? Authorizing Provider  albuterol (PROVENTIL HFA;VENTOLIN HFA) 108 (90 Base) MCG/ACT inhaler Inhale 1-2 puffs into the lungs every 6 (six) hours as needed for wheezing or shortness of breath.    Historical Provider, MD  amoxicillin (AMOXIL) 500 MG capsule Take 1 capsule (500 mg total) by mouth 2 (two) times daily. 02/14/16   Milagros Loll, MD    Family History History reviewed. No pertinent family history.  Social History Social History  Substance Use Topics  . Smoking status: Current Every Day Smoker    Packs/day: 0.50    Types: Cigarettes  . Smokeless tobacco: Never Used  . Alcohol use Yes     Comment: occ      Allergies   Asa [aspirin]   Review of Systems Review of Systems  Constitutional: Negative for fever.  Musculoskeletal: Positive for arthralgias.   Physical Exam Updated Vital Signs BP 130/81 (BP Location: Left Arm)   Pulse 100   Temp 98.8 F (37.1 C) (Oral)   Resp 18   Ht 5' (1.524 m)   Wt 176 lb (79.8 kg)   LMP 01/29/2016   SpO2 94%   BMI 34.37 kg/m   Physical Exam  Constitutional: She is oriented to person, place, and time. She appears well-developed and well-nourished. No distress.  HENT:  Head: Normocephalic and atraumatic.  Eyes: EOM are normal.  Neck: Normal range of motion.  Cardiovascular: Normal rate, regular rhythm and normal heart sounds.   Pulmonary/Chest: Effort normal and breath sounds normal.  Abdominal: Soft. She exhibits no distension. There is no tenderness.  Musculoskeletal: Normal range of motion. She exhibits tenderness.  tenderness and swelling noted to the lateral malleolus closed injury  Neurological: She is alert and oriented to person, place, and time.  Neurovascularly intact   Skin: Skin is warm and dry.  Psychiatric: She has a normal mood and affect. Judgment normal.  Nursing note and vitals reviewed.  ED Treatments / Results  Labs (all labs ordered are listed, but only abnormal results are displayed)  Labs Reviewed - No data to display  EKG  EKG Interpretation None       Radiology Dg Ankle Complete Left  Result Date: 02/16/2016 CLINICAL DATA:  Twisting injury of the left ankle. Occurred last night. EXAM: LEFT ANKLE COMPLETE - 3+ VIEW COMPARISON:  None. FINDINGS: There is no evidence of fracture, dislocation, or joint effusion. There is no evidence of arthropathy or other focal bone abnormality. Severe ir soft tissue swelling over the lateral malleolus. IMPRESSION: No acute osseous injury of the left ankle. Electronically Signed   By: Kathreen Devoid   On: 02/16/2016 10:42    Procedures Procedures (including critical care  time)  Medications Ordered in ED Medications - No data to display  DIAGNOSTIC STUDIES:  Oxygen Saturation is 94% on RA, normal by my interpretation.    COORDINATION OF CARE:  11:43 AM Discussed treatment Frost with pt at bedside and pt agreed to Frost.  Initial Impression / Assessment and Frost / ED Course  I have reviewed the triage vital signs and the nursing notes.  Pertinent labs & imaging results that were available during my care of the patient were reviewed by me and considered in my medical decision making (see chart for details).  Clinical Course    Ankle sprain. NVI. Crutches. RICE. PRN nsaids.   I personally performed the services described in this documentation, which was scribed in my presence. The recorded information has been reviewed and is accurate.   Final Clinical Impressions(s) / ED Diagnoses   Final diagnoses:  Sprain of left ankle, unspecified ligament, initial encounter    New Prescriptions New Prescriptions   No medications on file     Laura Manifold, MD 02/19/16 1542

## 2016-02-16 NOTE — ED Triage Notes (Signed)
PT states "I twisted my left ankle walking down 3 steps last night at 10pm". PT states pain with ambulation and swelling to out ankle noted.

## 2016-10-18 ENCOUNTER — Encounter (HOSPITAL_COMMUNITY): Payer: Self-pay | Admitting: Emergency Medicine

## 2016-10-18 ENCOUNTER — Emergency Department (HOSPITAL_COMMUNITY)
Admission: EM | Admit: 2016-10-18 | Discharge: 2016-10-18 | Disposition: A | Discharge status: Home / Self Care | Payer: Self-pay | Attending: Emergency Medicine | Admitting: Emergency Medicine

## 2016-10-18 DIAGNOSIS — F17210 Nicotine dependence, cigarettes, uncomplicated: Secondary | ICD-10-CM | POA: Insufficient documentation

## 2016-10-18 DIAGNOSIS — R112 Nausea with vomiting, unspecified: Secondary | ICD-10-CM | POA: Insufficient documentation

## 2016-10-18 DIAGNOSIS — R109 Unspecified abdominal pain: Secondary | ICD-10-CM | POA: Insufficient documentation

## 2016-10-18 DIAGNOSIS — R197 Diarrhea, unspecified: Secondary | ICD-10-CM | POA: Insufficient documentation

## 2016-10-18 DIAGNOSIS — Z5321 Procedure and treatment not carried out due to patient leaving prior to being seen by health care provider: Secondary | ICD-10-CM | POA: Insufficient documentation

## 2016-10-18 HISTORY — DX: Malignant neoplasm of cervix uteri, unspecified: C53.9

## 2016-10-18 MED ORDER — ONDANSETRON 4 MG PO TBDP
4.0000 mg | ORAL_TABLET | Freq: Once | ORAL | Status: AC
  Administered 2016-10-18: 4 mg via ORAL
  Filled 2016-10-18: qty 1

## 2016-10-18 NOTE — ED Notes (Signed)
Not in [place] during rounding

## 2016-10-18 NOTE — ED Notes (Signed)
Pt not in [place] for lab draw- per registration, pt left

## 2016-10-18 NOTE — ED Triage Notes (Signed)
Pt c/o some kind of possible bites to arms x 1 week. Round red bumps noted. C/o n/v/d since yesterday. Pt anxious and restless.

## 2016-10-18 NOTE — ED Triage Notes (Signed)
Pt complains of Chigger bites as well as N/V- since this am  Followed by [facility] Department,ent

## 2016-10-19 ENCOUNTER — Encounter (HOSPITAL_COMMUNITY): Payer: Self-pay | Admitting: Emergency Medicine

## 2016-10-19 ENCOUNTER — Emergency Department (HOSPITAL_COMMUNITY)
Admission: EM | Admit: 2016-10-19 | Discharge: 2016-10-19 | Disposition: A | Discharge status: Home / Self Care | Payer: Self-pay | Attending: Emergency Medicine | Admitting: Emergency Medicine

## 2016-10-19 DIAGNOSIS — S40862A Insect bite (nonvenomous) of left upper arm, initial encounter: Secondary | ICD-10-CM | POA: Insufficient documentation

## 2016-10-19 DIAGNOSIS — W57XXXA Bitten or stung by nonvenomous insect and other nonvenomous arthropods, initial encounter: Secondary | ICD-10-CM | POA: Insufficient documentation

## 2016-10-19 DIAGNOSIS — Y93H2 Activity, gardening and landscaping: Secondary | ICD-10-CM | POA: Insufficient documentation

## 2016-10-19 DIAGNOSIS — Y92096 Garden or yard of other non-institutional residence as the place of occurrence of the external cause: Secondary | ICD-10-CM | POA: Insufficient documentation

## 2016-10-19 DIAGNOSIS — R1110 Vomiting, unspecified: Secondary | ICD-10-CM | POA: Insufficient documentation

## 2016-10-19 DIAGNOSIS — R197 Diarrhea, unspecified: Secondary | ICD-10-CM | POA: Insufficient documentation

## 2016-10-19 DIAGNOSIS — Z886 Allergy status to analgesic agent status: Secondary | ICD-10-CM | POA: Insufficient documentation

## 2016-10-19 DIAGNOSIS — F17210 Nicotine dependence, cigarettes, uncomplicated: Secondary | ICD-10-CM | POA: Insufficient documentation

## 2016-10-19 DIAGNOSIS — Z8541 Personal history of malignant neoplasm of cervix uteri: Secondary | ICD-10-CM | POA: Insufficient documentation

## 2016-10-19 DIAGNOSIS — Y998 Other external cause status: Secondary | ICD-10-CM | POA: Insufficient documentation

## 2016-10-19 DIAGNOSIS — S80862A Insect bite (nonvenomous), left lower leg, initial encounter: Secondary | ICD-10-CM | POA: Insufficient documentation

## 2016-10-19 MED ORDER — ONDANSETRON 4 MG PO TBDP: 4.0000 mg | ORAL_TABLET | Freq: Three times a day (TID) | ORAL | 0 refills | Status: DC | PRN

## 2016-10-19 MED ORDER — TRIAMCINOLONE ACETONIDE 0.025 % EX OINT: 1.0000 "application " | TOPICAL_OINTMENT | Freq: Two times a day (BID) | CUTANEOUS | 1 refills | Status: DC

## 2016-10-19 MED ORDER — ONDANSETRON 4 MG PO TBDP
4.0000 mg | ORAL_TABLET | Freq: Once | ORAL | Status: AC
  Administered 2016-10-19: 4 mg via ORAL
  Filled 2016-10-19: qty 1

## 2016-10-19 NOTE — ED Provider Notes (Signed)
[facility] DEPT Provider Note   CSN: [id number] Arrival date & time: 10/19/16  1013     History   Chief Complaint Chief Complaint  Patient presents with  . Abdominal Pain  . Insect Bite    HPI Laura Frost is a 51 y.o. female.  HPI  The patient is a 51 year old female, she has a known history of no significant past medical history, she presents after having some insect bites to her arm after mowing the yard. She felt as though she was having chigger bites, they have been itchy, she has been putting bleach on her skin, this has not really helped that much. She also complains of 24 hours of nausea vomiting and diarrhea. She had 3 episodes of vomiting yesterday, none today but she is still nauseated. She also had multiple loose stools in the last 24 hours, she feels like things are gradually improving. No fevers today but she was told she had a fever yesterday. She has a mild amount of lower abdominal cramping, no urinary symptoms, no blood in the stools, no swelling of the legs, no headaches today. She was drinking lots of Pedialyte over the last 24 hours trying to hydrate  Past Medical History:  Diagnosis Date  . Bronchitis   . Cervical cancer ([place])     There are no active problems to display for this patient.   History reviewed. No pertinent surgical history.  OB History    Gravida Para Term Preterm AB Living   1         1   SAB TAB Ectopic Multiple Live Births                   Home Medications    Prior to Admission medications   Medication Sig Start Date End Date Taking? Authorizing Provider  albuterol (PROVENTIL HFA;VENTOLIN HFA) 108 (90 Base) MCG/ACT inhaler Inhale 1-2 puffs into the lungs every 6 (six) hours as needed for wheezing or shortness of breath.   Yes [provider]  lisinopril (PRINIVIL,ZESTRIL) 10 MG tablet Take 10 mg by mouth daily.   Yes [provider]  amoxicillin (AMOXIL) 500 MG capsule Take 1 capsule (500 mg total) by mouth  2 (two) times daily. Patient not taking: Reported on 10/19/2016 02/14/16   Milagros Loll, MD  guaifenesin (ROBITUSSIN) 100 MG/5ML syrup Take 200 mg by mouth 3 (three) times daily as needed for cough.    [provider]  ondansetron (ZOFRAN ODT) 4 MG disintegrating tablet Take 1 tablet (4 mg total) by mouth every 8 (eight) hours as needed for nausea. 10/19/16   Noemi Chapel, MD  triamcinolone (KENALOG) 0.025 % ointment Apply 1 application topically 2 (two) times daily. Do not apply to face 10/19/16   Noemi Chapel, MD    Family History No family history on file.  Social History Social History  Substance Use Topics  . Smoking status: Current Every Day Smoker    Packs/day: 0.50    Types: Cigarettes  . Smokeless tobacco: Never Used  . Alcohol use Yes     Comment: occ     Allergies   Asa [aspirin]   Review of Systems Review of Systems  All other systems reviewed and are negative.    Physical Exam Updated Vital Signs There were no vitals taken for this visit.  Physical Exam  Constitutional: She appears well-developed and well-nourished. No distress.  HENT:  Head: Normocephalic and atraumatic.  Mouth/Throat: Oropharynx is clear and moist. No oropharyngeal  exudate.  Eyes: Conjunctivae and EOM are normal. Pupils are equal, round, and reactive to light. Right eye exhibits no discharge. Left eye exhibits no discharge. No scleral icterus.  Neck: Normal range of motion. Neck supple. No JVD present. No thyromegaly present.  Cardiovascular: Normal rate, regular rhythm, normal heart sounds and intact distal pulses.  Exam reveals no gallop and no friction rub.   No murmur heard. Pulmonary/Chest: Effort normal and breath sounds normal. No respiratory distress. She has no wheezes. She has no rales.  Abdominal: Soft. Bowel sounds are normal. She exhibits no distension and no mass. There is no tenderness.  Musculoskeletal: Normal range of motion. She exhibits no edema or tenderness.    Lymphadenopathy:    She has no cervical adenopathy.  Neurological: She is alert. Coordination normal.  Skin: Skin is warm and dry. Rash noted. No erythema.  Multiple small bites to the LUE and the LLE.  No pustules / vesicles / petechia - purpura.    Psychiatric: She has a normal mood and affect. Her behavior is normal.  Nursing note and vitals reviewed.    ED Treatments / Results  Labs (all labs ordered are listed, but only abnormal results are displayed) Labs Reviewed - No data to display   Radiology No results found.  Procedures Procedures (including critical care time)  Medications Ordered in ED Medications  ondansetron (ZOFRAN-ODT) disintegrating tablet 4 mg (not administered)     Initial Impression / Assessment and Plan / ED Course  I have reviewed the triage vital signs and the nursing notes.  Pertinent labs & imaging results that were available during my care of the patient were reviewed by me and considered in my medical decision making (see chart for details).     Mild chigger bites - Trimacinalone cream Zofran for n/v/d - she declined IVF Well appaering otherwse. Pt agreeable to reutrn as needed  Final Clinical Impressions(s) / ED Diagnoses   Final diagnoses:  Insect bite, initial encounter  Vomiting and diarrhea    New Prescriptions New Prescriptions   ONDANSETRON (ZOFRAN ODT) 4 MG DISINTEGRATING TABLET    Take 1 tablet (4 mg total) by mouth every 8 (eight) hours as needed for nausea.   TRIAMCINOLONE (KENALOG) 0.025 % OINTMENT    Apply 1 application topically 2 (two) times daily. Do not apply to face     Noemi Chapel, MD 10/19/16 [phone]

## 2016-10-19 NOTE — Discharge Instructions (Signed)
Zofran for nausea Drink plenty of fluids Use the steroid cream on your skin twice daily for 10 days The itching will likely continue for a week or 2. ER for worsening symptoms.  Please obtain all of your results from medical records or have your doctors office obtain the results - share them with your doctor - you should be seen at your doctors office in the next 2 days. Call today to arrange your follow up. Take the medications as prescribed. Please review all of the medicines and only take them if you do not have an allergy to them. Please be aware that if you are taking birth control pills, taking other prescriptions, ESPECIALLY ANTIBIOTICS may make the birth control ineffective - if this is the case, either do not engage in sexual activity or use alternative methods of birth control such as condoms until you have finished the medicine and your family doctor says it is OK to restart them. If you are on a blood thinner such as COUMADIN, be aware that any other medicine that you take may cause the coumadin to either work too much, or not enough - you should have your coumadin level rechecked in next 7 days if this is the case.  ?  It is also a possibility that you have an allergic reaction to any of the medicines that you have been prescribed - Everybody reacts differently to medications and while MOST people have no trouble with most medicines, you may have a reaction such as nausea, vomiting, rash, swelling, shortness of breath. If this is the case, please stop taking the medicine immediately and contact your physician.  ?  You should return to the ER if you develop severe or worsening symptoms.

## 2016-10-19 NOTE — ED Triage Notes (Signed)
Pt reports n/v/d x 2 days. Been drinking Pedialyte still having stomach upset. Pt also had insect bites to bilateral arm and legs

## 2017-04-02 ENCOUNTER — Other Ambulatory Visit: Payer: Self-pay

## 2017-04-02 ENCOUNTER — Emergency Department
Admission: EM | Admit: 2017-04-02 | Discharge: 2017-04-02 | Disposition: A | Discharge status: Home / Self Care | Payer: No Typology Code available for payment source | Attending: Emergency Medicine | Admitting: Emergency Medicine

## 2017-04-02 ENCOUNTER — Encounter: Payer: Self-pay | Admitting: *Deleted

## 2017-04-02 DIAGNOSIS — Y929 Unspecified place or not applicable: Secondary | ICD-10-CM | POA: Insufficient documentation

## 2017-04-02 DIAGNOSIS — Z8541 Personal history of malignant neoplasm of cervix uteri: Secondary | ICD-10-CM | POA: Diagnosis not present

## 2017-04-02 DIAGNOSIS — Y999 Unspecified external cause status: Secondary | ICD-10-CM | POA: Insufficient documentation

## 2017-04-02 DIAGNOSIS — S2001XA Contusion of right breast, initial encounter: Secondary | ICD-10-CM | POA: Insufficient documentation

## 2017-04-02 DIAGNOSIS — S20101A Unspecified superficial injuries of breast, right breast, initial encounter: Secondary | ICD-10-CM | POA: Diagnosis present

## 2017-04-02 DIAGNOSIS — F17210 Nicotine dependence, cigarettes, uncomplicated: Secondary | ICD-10-CM | POA: Insufficient documentation

## 2017-04-02 DIAGNOSIS — Z79899 Other long term (current) drug therapy: Secondary | ICD-10-CM | POA: Diagnosis not present

## 2017-04-02 DIAGNOSIS — Y939 Activity, unspecified: Secondary | ICD-10-CM | POA: Insufficient documentation

## 2017-04-02 MED ORDER — IBUPROFEN 800 MG PO TABS: 800.0000 mg | ORAL_TABLET | Freq: Three times a day (TID) | ORAL | 0 refills | Status: DC | PRN
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
## 2017-04-02 NOTE — ED Notes (Signed)
Pt verbalizes understanding of d/c instructions, medications and follow up 
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
## 2018-03-04 ENCOUNTER — Ambulatory Visit: Payer: Self-pay | Attending: Oncology

## 2019-02-22 ENCOUNTER — Emergency Department (HOSPITAL_COMMUNITY): Payer: Self-pay

## 2019-02-22 ENCOUNTER — Emergency Department (HOSPITAL_COMMUNITY)
Admission: EM | Admit: 2019-02-22 | Discharge: 2019-02-22 | Disposition: A | Discharge status: Home / Self Care | Payer: Self-pay | Attending: Emergency Medicine | Admitting: Emergency Medicine

## 2019-02-22 ENCOUNTER — Other Ambulatory Visit: Payer: Self-pay

## 2019-02-22 ENCOUNTER — Encounter (HOSPITAL_COMMUNITY): Payer: Self-pay | Admitting: Emergency Medicine

## 2019-02-22 DIAGNOSIS — S5011XA Contusion of right forearm, initial encounter: Secondary | ICD-10-CM | POA: Insufficient documentation

## 2019-02-22 DIAGNOSIS — S62639A Displaced fracture of distal phalanx of unspecified finger, initial encounter for closed fracture: Secondary | ICD-10-CM

## 2019-02-22 DIAGNOSIS — F17210 Nicotine dependence, cigarettes, uncomplicated: Secondary | ICD-10-CM | POA: Insufficient documentation

## 2019-02-22 DIAGNOSIS — Y929 Unspecified place or not applicable: Secondary | ICD-10-CM | POA: Insufficient documentation

## 2019-02-22 DIAGNOSIS — W109XXA Fall (on) (from) unspecified stairs and steps, initial encounter: Secondary | ICD-10-CM | POA: Insufficient documentation

## 2019-02-22 DIAGNOSIS — Y939 Activity, unspecified: Secondary | ICD-10-CM | POA: Insufficient documentation

## 2019-02-22 DIAGNOSIS — S62664A Nondisplaced fracture of distal phalanx of right ring finger, initial encounter for closed fracture: Secondary | ICD-10-CM | POA: Insufficient documentation

## 2019-02-22 DIAGNOSIS — I10 Essential (primary) hypertension: Secondary | ICD-10-CM | POA: Insufficient documentation

## 2019-02-22 DIAGNOSIS — Y999 Unspecified external cause status: Secondary | ICD-10-CM | POA: Insufficient documentation

## 2019-02-22 DIAGNOSIS — Z79899 Other long term (current) drug therapy: Secondary | ICD-10-CM | POA: Insufficient documentation

## 2019-02-22 HISTORY — DX: Essential (primary) hypertension: I10

## 2019-02-22 HISTORY — DX: Type 2 diabetes mellitus without complications: E11.9

## 2019-02-22 MED ORDER — IBUPROFEN 800 MG PO TABS: 800.0000 mg | ORAL_TABLET | Freq: Three times a day (TID) | ORAL | 0 refills | Status: DC

## 2019-02-22 MED ORDER — HYDROCODONE-ACETAMINOPHEN 5-325 MG PO TABS: ORAL_TABLET | ORAL | 0 refills | Status: DC

## 2019-02-22 MED ORDER — IBUPROFEN 800 MG PO TABS
800.0000 mg | ORAL_TABLET | Freq: Once | ORAL | Status: AC
  Administered 2019-02-22: 800 mg via ORAL
  Filled 2019-02-22: qty 1

## 2019-02-22 NOTE — Discharge Instructions (Signed)
Elevate and apply ice packs on and off to forearm.  Keep your finger splinted.  Call Dr. Ruthe Mannan office to arrange a follow-up appointment.

## 2019-02-22 NOTE — ED Triage Notes (Signed)
Tripped and fell down 2 stairs yesterday.  Bruising and pain to RT elbow and RT ring finger.

## 2019-02-22 NOTE — ED Provider Notes (Signed)
[facility] EMERGENCY DEPARTMENT Provider Note   CSN: [id number] Arrival date & time: 02/22/19  1219     History   Chief Complaint Chief Complaint  Patient presents with   Fall    HPI Laura Frost is a 53 y.o. female.     HPI   Laura Frost is a 53 y.o. female who presents to the Emergency Department complaining of pain, bruising, and swelling of her right forearm in the distal end of the right ring finger.  She describes a mechanical fall that occurred yesterday when she slipped on a wet step landing on her right arm.  She describes a throbbing sensation along her forearm that is worse with movement and palpation.  She notes having swelling and discoloration to the tip of her finger.  She states that she struck this area with a needle last evening trying to drain fluid from her finger.  She describes her finger as throbbing with minimal tenderness.  She denies head injury, LOC, neck pain pain to her shoulder, wrist or elbow.  She has not tried any medications or therapies for symptom relief.    Past Medical History:  Diagnosis Date   Bronchitis    Cervical cancer ([place])    Diabetes mellitus without complication ([place])    Hypertension     There are no active problems to display for this patient.   Past Surgical History:  Procedure Laterality Date   OVARY SURGERY       OB History    Gravida  1   Para      Term      Preterm      AB      Living  1     SAB      TAB      Ectopic      Multiple      Live Births               Home Medications    Prior to Admission medications   Medication Sig Start Date End Date Taking? Authorizing Provider  albuterol (PROVENTIL HFA;VENTOLIN HFA) 108 (90 Base) MCG/ACT inhaler Inhale 1-2 puffs into the lungs every 6 (six) hours as needed for wheezing or shortness of breath.    [provider]  amoxicillin (AMOXIL) 500 MG capsule Take 1 capsule (500 mg total) by mouth 2 (two) times daily. Patient not  taking: Reported on 10/19/2016 02/14/16   Milagros Loll, MD  guaifenesin (ROBITUSSIN) 100 MG/5ML syrup Take 200 mg by mouth 3 (three) times daily as needed for cough.    [provider]  ibuprofen (ADVIL,MOTRIN) 800 MG tablet Take 1 tablet (800 mg total) by mouth every 8 (eight) hours as needed. 04/02/17   Ravi Tuccillo, Cari B, FNP  lisinopril (PRINIVIL,ZESTRIL) 10 MG tablet Take 10 mg by mouth daily.    [provider]  ondansetron (ZOFRAN ODT) 4 MG disintegrating tablet Take 1 tablet (4 mg total) by mouth every 8 (eight) hours as needed for nausea. 10/19/16   Noemi Chapel, MD  triamcinolone (KENALOG) 0.025 % ointment Apply 1 application topically 2 (two) times daily. Do not apply to face 10/19/16   Noemi Chapel, MD    Family History No family history on file.  Social History Social History   Tobacco Use   Smoking status: Current Every Day Smoker    Packs/day: 0.50    Types: Cigarettes   Smokeless tobacco: Never Used  Substance Use Topics   Alcohol use: Yes  Comment: occ   Drug use: No     Allergies   Asa [aspirin]   Review of Systems Review of Systems  Constitutional: Negative for chills and fever.  Eyes: Negative for visual disturbance.  Cardiovascular: Negative for chest pain.  Gastrointestinal: Negative for abdominal pain, nausea and vomiting.  Genitourinary: Negative for difficulty urinating.  Musculoskeletal: Positive for myalgias (mid right Forearm pain and swelling). Negative for back pain, joint swelling and neck pain.  Skin: Positive for color change (Bruising of her right forearm). Negative for wound.  Neurological: Negative for dizziness, syncope, weakness and headaches.     Physical Exam Updated Vital Signs BP 136/81 (BP Location: Left Arm)    Pulse 80    Temp 98.7 F (37.1 C) (Oral)    Resp 18    Ht 5\' 1"  (1.549 m)    Wt 81.6 kg    SpO2 100%    BMI 34.01 kg/m   Physical Exam Vitals signs and nursing note reviewed.  Constitutional:        General: She is not in acute distress.    Appearance: Normal appearance.  HENT:     Head: Atraumatic.  Neck:     Musculoskeletal: Normal range of motion. No muscular tenderness.  Cardiovascular:     Rate and Rhythm: Normal rate and regular rhythm.     Pulses: Normal pulses.  Pulmonary:     Effort: Pulmonary effort is normal.     Breath sounds: Normal breath sounds.  Chest:     Chest wall: No tenderness.  Abdominal:     General: There is no distension.     Palpations: Abdomen is soft.     Tenderness: There is no abdominal tenderness.  Musculoskeletal: Normal range of motion.        General: Swelling, tenderness and signs of injury present.     Right forearm: She exhibits tenderness and swelling. She exhibits no bony tenderness.     Comments: Focal edema and ecchymosis to the mid right forearm.  Compartments are soft.  Right elbow wrist and shoulder are nontender and she has full range of motion of the extremity.  Grip strength is strong and symmetrical.  Ecchymosis is present at the distal end of the volar surface of the right ring finger.  No subungual hematoma. Nail is intact  Lymphadenopathy:     Cervical: No cervical adenopathy.  Skin:    General: Skin is warm.     Capillary Refill: Capillary refill takes less than 2 seconds.  Neurological:     Mental Status: She is alert.      ED Treatments / Results  Labs (all labs ordered are listed, but only abnormal results are displayed) Labs Reviewed - No data to display  EKG None  Radiology Dg Forearm Right  Result Date: 02/22/2019 CLINICAL DATA:  Pain EXAM: RIGHT FOREARM - 2 VIEW COMPARISON:  None. FINDINGS: There is no evidence of fracture. A tiny enthesophyte is seen at the insertion of the triceps tendon. There is soft tissue swelling around the forearm. IMPRESSION: No acute bony abnormality. Soft tissue swelling around the forearm. Electronically Signed   By: Zerita Boers M.D.   On: 02/22/2019 13:16   Dg Finger  Ring Right  Result Date: 02/22/2019 CLINICAL DATA:  Status post fall. Bruising and pain to the right ring finger. EXAM: RIGHT RING FINGER 2+V COMPARISON:  None. FINDINGS: Comminuted fracture of the distal aspect of the distal fourth right phalanx with associated soft tissue swelling. No other  fractures are seen. Normal osseous mineralization. IMPRESSION: Comminuted fracture of the distal aspect of the right distal fourth phalanx with associated soft tissue swelling. Electronically Signed   By: Fidela Salisbury M.D.   On: 02/22/2019 16:38    Procedures Procedures (including critical care time)  Medications Ordered in ED Medications  ibuprofen (ADVIL) tablet 800 mg (has no administration in time range)     Initial Impression / Assessment and Plan / ED Course  I have reviewed the triage vital signs and the nursing notes.  Pertinent labs & imaging results that were available during my care of the patient were reviewed by me and considered in my medical decision making (see chart for details).       Patient with injury secondary to a mechanical fall.  X-ray shows a fracture of the distal phalanx of the right ring finger.  Nail is intact and no subungual hematoma is present.  Sling applied and the finger was splinted by nursing staff.  She remains neurovascularly intact.  Patient agrees to local orthopedic follow-up.    Final Clinical Impressions(s) / ED Diagnoses   Final diagnoses:  Contusion of right forearm, initial encounter  Closed fracture of tuft of distal phalanx of finger    ED Discharge Orders    None       Kem Parkinson, PA-C 02/22/19 1645    Milton Ferguson, MD 02/27/19 1032

## 2021-02-15 ENCOUNTER — Other Ambulatory Visit: Payer: Self-pay

## 2021-02-15 ENCOUNTER — Ambulatory Visit (HOSPITAL_COMMUNITY)
Admission: EM | Disposition: A | Discharge status: Home / Self Care | Payer: Self-pay | Source: Home / Self Care | Attending: Emergency Medicine

## 2021-02-15 ENCOUNTER — Encounter (HOSPITAL_COMMUNITY): Payer: Self-pay

## 2021-02-15 ENCOUNTER — Ambulatory Visit (HOSPITAL_COMMUNITY)
Admission: EM | Admit: 2021-02-15 | Discharge: 2021-02-16 | Disposition: A | Discharge status: Home / Self Care | Payer: Self-pay | Attending: Cardiology | Admitting: Cardiology

## 2021-02-15 ENCOUNTER — Emergency Department (HOSPITAL_COMMUNITY): Payer: Self-pay

## 2021-02-15 DIAGNOSIS — Z886 Allergy status to analgesic agent status: Secondary | ICD-10-CM | POA: Insufficient documentation

## 2021-02-15 DIAGNOSIS — I2510 Atherosclerotic heart disease of native coronary artery without angina pectoris: Secondary | ICD-10-CM

## 2021-02-15 DIAGNOSIS — I214 Non-ST elevation (NSTEMI) myocardial infarction: Secondary | ICD-10-CM | POA: Insufficient documentation

## 2021-02-15 DIAGNOSIS — Z8249 Family history of ischemic heart disease and other diseases of the circulatory system: Secondary | ICD-10-CM | POA: Insufficient documentation

## 2021-02-15 DIAGNOSIS — Z20822 Contact with and (suspected) exposure to covid-19: Secondary | ICD-10-CM | POA: Insufficient documentation

## 2021-02-15 DIAGNOSIS — Z79899 Other long term (current) drug therapy: Secondary | ICD-10-CM | POA: Insufficient documentation

## 2021-02-15 DIAGNOSIS — F17210 Nicotine dependence, cigarettes, uncomplicated: Secondary | ICD-10-CM | POA: Insufficient documentation

## 2021-02-15 DIAGNOSIS — Z7984 Long term (current) use of oral hypoglycemic drugs: Secondary | ICD-10-CM | POA: Insufficient documentation

## 2021-02-15 DIAGNOSIS — Z720 Tobacco use: Secondary | ICD-10-CM

## 2021-02-15 DIAGNOSIS — I10 Essential (primary) hypertension: Secondary | ICD-10-CM | POA: Insufficient documentation

## 2021-02-15 DIAGNOSIS — E1169 Type 2 diabetes mellitus with other specified complication: Secondary | ICD-10-CM

## 2021-02-15 DIAGNOSIS — E119 Type 2 diabetes mellitus without complications: Secondary | ICD-10-CM | POA: Insufficient documentation

## 2021-02-15 DIAGNOSIS — I25110 Atherosclerotic heart disease of native coronary artery with unstable angina pectoris: Secondary | ICD-10-CM | POA: Insufficient documentation

## 2021-02-15 HISTORY — DX: Type 2 diabetes mellitus without complications: E11.9

## 2021-02-15 HISTORY — PX: LEFT HEART CATH AND CORONARY ANGIOGRAPHY: CATH118249

## 2021-02-15 LAB — GLUCOSE, CAPILLARY
Glucose-Capillary: 212 mg/dL — ABNORMAL HIGH (ref 70–99)
Glucose-Capillary: 293 mg/dL — ABNORMAL HIGH (ref 70–99)

## 2021-02-15 LAB — PROTIME-INR
INR: 0.9 (ref 0.8–1.2)
Prothrombin Time: 11.8 seconds (ref 11.4–15.2)

## 2021-02-15 LAB — CBC WITH DIFFERENTIAL/PLATELET
Abs Immature Granulocytes: 0.02 10*3/uL (ref 0.00–0.07)
Basophils Absolute: 0 10*3/uL (ref 0.0–0.1)
Basophils Relative: 1 %
Eosinophils Absolute: 0.1 10*3/uL (ref 0.0–0.5)
Eosinophils Relative: 2 %
HCT: 52.7 % — ABNORMAL HIGH (ref 36.0–46.0)
Hemoglobin: 17.6 g/dL — ABNORMAL HIGH (ref 12.0–15.0)
Immature Granulocytes: 0 %
Lymphocytes Relative: 47 %
Lymphs Abs: 2.6 10*3/uL (ref 0.7–4.0)
MCH: 34.5 pg — ABNORMAL HIGH (ref 26.0–34.0)
MCHC: 33.4 g/dL (ref 30.0–36.0)
MCV: 103.3 fL — ABNORMAL HIGH (ref 80.0–100.0)
Monocytes Absolute: 0.4 10*3/uL (ref 0.1–1.0)
Monocytes Relative: 7 %
Neutro Abs: 2.4 10*3/uL (ref 1.7–7.7)
Neutrophils Relative %: 43 %
Platelets: 179 10*3/uL (ref 150–400)
RBC: 5.1 MIL/uL (ref 3.87–5.11)
RDW: 12.7 % (ref 11.5–15.5)
WBC: 5.5 10*3/uL (ref 4.0–10.5)
nRBC: 0 % (ref 0.0–0.2)

## 2021-02-15 LAB — COMPREHENSIVE METABOLIC PANEL
ALT: 35 U/L (ref 0–44)
AST: 24 U/L (ref 15–41)
Albumin: 3.9 g/dL (ref 3.5–5.0)
Alkaline Phosphatase: 92 U/L (ref 38–126)
Anion gap: 11 (ref 5–15)
BUN: 9 mg/dL (ref 6–20)
CO2: 22 mmol/L (ref 22–32)
Calcium: 9.1 mg/dL (ref 8.9–10.3)
Chloride: 101 mmol/L (ref 98–111)
Creatinine, Ser: 0.7 mg/dL (ref 0.44–1.00)
GFR, Estimated: >60 mL/min (ref >60)
Glucose, Bld: 327 mg/dL — ABNORMAL HIGH (ref 70–99)
Potassium: 4 mmol/L (ref 3.5–5.1)
Sodium: 134 mmol/L — ABNORMAL LOW (ref 135–145)
Total Bilirubin: 0.8 mg/dL (ref 0.3–1.2)
Total Protein: 7 g/dL (ref 6.5–8.1)

## 2021-02-15 LAB — TROPONIN I (HIGH SENSITIVITY)
Troponin I (High Sensitivity): 7 ng/L (ref <18)
Troponin I (High Sensitivity): 304 ng/L (ref <18)

## 2021-02-15 LAB — D-DIMER, QUANTITATIVE: D-Dimer, Quant: <0.27 ug/mL-FEU (ref 0.00–0.50)

## 2021-02-15 LAB — MAGNESIUM: Magnesium: 1.7 mg/dL (ref 1.7–2.4)

## 2021-02-15 LAB — CBG MONITORING, ED: Glucose-Capillary: 322 mg/dL — ABNORMAL HIGH (ref 70–99)

## 2021-02-15 LAB — RESP PANEL BY RT-PCR (FLU A&B, COVID) ARPGX2
Influenza A by PCR: NEGATIVE
Influenza B by PCR: NEGATIVE
SARS Coronavirus 2 by RT PCR: NEGATIVE

## 2021-02-15 LAB — POC URINE PREG, ED: Preg Test, Ur: NEGATIVE

## 2021-02-15 LAB — BRAIN NATRIURETIC PEPTIDE: B Natriuretic Peptide: 48 pg/mL (ref 0.0–100.0)

## 2021-02-15 SURGERY — LEFT HEART CATH AND CORONARY ANGIOGRAPHY
Anesthesia: LOCAL

## 2021-02-15 MED ORDER — FENTANYL CITRATE (PF) 100 MCG/2ML IJ SOLN
INTRAMUSCULAR | Status: DC | PRN
  Administered 2021-02-15: 25 ug via INTRAVENOUS

## 2021-02-15 MED ORDER — IOHEXOL 350 MG/ML SOLN
INTRAVENOUS | Status: DC | PRN
  Administered 2021-02-15: 43 mL via INTRA_ARTERIAL

## 2021-02-15 MED ORDER — LIDOCAINE HCL (PF) 1 % IJ SOLN
INTRAMUSCULAR | Status: DC | PRN
  Administered 2021-02-15: 2 mL via SUBCUTANEOUS

## 2021-02-15 MED ORDER — ATORVASTATIN CALCIUM 80 MG PO TABS
80.0000 mg | ORAL_TABLET | Freq: Every day | ORAL | Status: DC
  Administered 2021-02-15: 80 mg via ORAL
  Filled 2021-02-15: qty 1

## 2021-02-15 MED ORDER — VERAPAMIL HCL 2.5 MG/ML IV SOLN
INTRAVENOUS | Status: AC
  Filled 2021-02-15: qty 2

## 2021-02-15 MED ORDER — NITROGLYCERIN 0.4 MG SL SUBL: 0.4000 mg | SUBLINGUAL_TABLET | SUBLINGUAL | Status: DC | PRN

## 2021-02-15 MED ORDER — DIPHENHYDRAMINE HCL 25 MG PO CAPS
25.0000 mg | ORAL_CAPSULE | Freq: Every evening | ORAL | Status: DC | PRN
  Administered 2021-02-15: 25 mg via ORAL
  Filled 2021-02-15: qty 1

## 2021-02-15 MED ORDER — SODIUM CHLORIDE 0.9 % IV SOLN: 250.0000 mL | INTRAVENOUS | Status: DC | PRN

## 2021-02-15 MED ORDER — HEPARIN SODIUM (PORCINE) 1000 UNIT/ML IJ SOLN
INTRAMUSCULAR | Status: DC | PRN
  Administered 2021-02-15: 3500 [IU] via INTRAVENOUS

## 2021-02-15 MED ORDER — SODIUM CHLORIDE 0.9% FLUSH: 3.0000 mL | INTRAVENOUS | Status: DC | PRN

## 2021-02-15 MED ORDER — HYDRALAZINE HCL 20 MG/ML IJ SOLN
10.0000 mg | INTRAMUSCULAR | Status: AC | PRN
  Administered 2021-02-15: 10 mg via INTRAVENOUS
  Filled 2021-02-15: qty 1

## 2021-02-15 MED ORDER — MORPHINE SULFATE (PF) 4 MG/ML IV SOLN
4.0000 mg | Freq: Once | INTRAVENOUS | Status: AC
  Administered 2021-02-15: 4 mg via INTRAVENOUS
  Filled 2021-02-15: qty 1

## 2021-02-15 MED ORDER — ACETAMINOPHEN 325 MG PO TABS: 650.0000 mg | ORAL_TABLET | ORAL | Status: DC | PRN

## 2021-02-15 MED ORDER — LISINOPRIL 10 MG PO TABS
10.0000 mg | ORAL_TABLET | Freq: Every day | ORAL | Status: DC
  Administered 2021-02-16: 10 mg via ORAL
  Filled 2021-02-15: qty 1

## 2021-02-15 MED ORDER — SODIUM CHLORIDE 0.9% FLUSH
3.0000 mL | Freq: Two times a day (BID) | INTRAVENOUS | Status: DC
  Administered 2021-02-16: 3 mL via INTRAVENOUS

## 2021-02-15 MED ORDER — FENTANYL CITRATE (PF) 100 MCG/2ML IJ SOLN
INTRAMUSCULAR | Status: AC
  Filled 2021-02-15: qty 2

## 2021-02-15 MED ORDER — METOPROLOL TARTRATE 12.5 MG HALF TABLET
12.5000 mg | ORAL_TABLET | Freq: Two times a day (BID) | ORAL | Status: DC
  Administered 2021-02-15 – 2021-02-16 (×2): 12.5 mg via ORAL
  Filled 2021-02-15 (×2): qty 1

## 2021-02-15 MED ORDER — NITROGLYCERIN 1 MG/10 ML FOR IR/CATH LAB
INTRA_ARTERIAL | Status: AC
  Filled 2021-02-15: qty 10

## 2021-02-15 MED ORDER — SODIUM CHLORIDE 0.9 % IV SOLN: INTRAVENOUS | Status: AC

## 2021-02-15 MED ORDER — LIDOCAINE HCL (PF) 1 % IJ SOLN
INTRAMUSCULAR | Status: AC
  Filled 2021-02-15: qty 30

## 2021-02-15 MED ORDER — HEPARIN (PORCINE) IN NACL 1000-0.9 UT/500ML-% IV SOLN
INTRAVENOUS | Status: AC
  Filled 2021-02-15: qty 500

## 2021-02-15 MED ORDER — HEPARIN (PORCINE) 25000 UT/250ML-% IV SOLN
750.0000 [IU]/h | INTRAVENOUS | Status: DC
  Administered 2021-02-15: 750 [IU]/h via INTRAVENOUS
  Filled 2021-02-15: qty 250

## 2021-02-15 MED ORDER — ONDANSETRON HCL 4 MG/2ML IJ SOLN
4.0000 mg | Freq: Once | INTRAMUSCULAR | Status: AC
  Administered 2021-02-15: 4 mg via INTRAVENOUS
  Filled 2021-02-15: qty 2

## 2021-02-15 MED ORDER — MIDAZOLAM HCL 2 MG/2ML IJ SOLN
INTRAMUSCULAR | Status: AC
  Filled 2021-02-15: qty 2

## 2021-02-15 MED ORDER — VERAPAMIL HCL 2.5 MG/ML IV SOLN
INTRA_ARTERIAL | Status: DC | PRN
  Administered 2021-02-15: 10 mL via INTRA_ARTERIAL

## 2021-02-15 MED ORDER — LABETALOL HCL 5 MG/ML IV SOLN: 10.0000 mg | INTRAVENOUS | Status: AC | PRN

## 2021-02-15 MED ORDER — ALBUTEROL SULFATE (2.5 MG/3ML) 0.083% IN NEBU: 3.0000 mL | INHALATION_SOLUTION | Freq: Four times a day (QID) | RESPIRATORY_TRACT | Status: DC | PRN

## 2021-02-15 MED ORDER — SODIUM CHLORIDE 0.9 % IV SOLN: INTRAVENOUS | Status: DC

## 2021-02-15 MED ORDER — MORPHINE SULFATE (PF) 2 MG/ML IV SOLN
2.0000 mg | INTRAVENOUS | Status: DC | PRN
Start: 2021-02-15 — End: 2021-02-17

## 2021-02-15 MED ORDER — HEPARIN BOLUS VIA INFUSION
4000.0000 [IU] | Freq: Once | INTRAVENOUS | Status: AC
  Administered 2021-02-15: 4000 [IU] via INTRAVENOUS

## 2021-02-15 MED ORDER — INSULIN ASPART 100 UNIT/ML IJ SOLN
0.0000 [IU] | Freq: Three times a day (TID) | INTRAMUSCULAR | Status: DC
  Administered 2021-02-15: 5 [IU] via SUBCUTANEOUS
  Administered 2021-02-16: 3 [IU] via SUBCUTANEOUS
  Administered 2021-02-16: 5 [IU] via SUBCUTANEOUS
  Administered 2021-02-16: 8 [IU] via SUBCUTANEOUS

## 2021-02-15 MED ORDER — MIDAZOLAM HCL 2 MG/2ML IJ SOLN
INTRAMUSCULAR | Status: DC | PRN
  Administered 2021-02-15: 1 mg via INTRAVENOUS

## 2021-02-15 MED ORDER — NITROGLYCERIN 0.4 MG SL SUBL
0.4000 mg | SUBLINGUAL_TABLET | Freq: Once | SUBLINGUAL | Status: AC
  Administered 2021-02-15: 0.4 mg via SUBLINGUAL
  Filled 2021-02-15: qty 1

## 2021-02-15 MED ORDER — ONDANSETRON HCL 4 MG/2ML IJ SOLN: 4.0000 mg | Freq: Four times a day (QID) | INTRAMUSCULAR | Status: DC | PRN

## 2021-02-15 MED ORDER — HEPARIN SODIUM (PORCINE) 1000 UNIT/ML IJ SOLN
INTRAMUSCULAR | Status: AC
  Filled 2021-02-15: qty 1

## 2021-02-15 SURGICAL SUPPLY — 12 items
CATH INFINITI 5 FR JL3.5 (CATHETERS) ×2 IMPLANT
CATH INFINITI 5FR ANG PIGTAIL (CATHETERS) ×2 IMPLANT
CATH OPTITORQUE TIG 4.0 5F (CATHETERS) ×1 IMPLANT
DEVICE RAD COMP TR BAND LRG (VASCULAR PRODUCTS) ×1 IMPLANT
GLIDESHEATH SLEND A-KIT 6F 22G (SHEATH) ×1 IMPLANT
GUIDEWIRE INQWIRE 1.5J.035X260 (WIRE) ×1 IMPLANT
INQWIRE 1.5J .035X260CM (WIRE) ×2
KIT HEART LEFT (KITS) ×2 IMPLANT
PACK CARDIAC CATHETERIZATION (CUSTOM PROCEDURE TRAY) ×2 IMPLANT
TRANSDUCER W/STOPCOCK (MISCELLANEOUS) ×2 IMPLANT
TUBING CIL FLEX 10 FLL-RA (TUBING) ×2 IMPLANT
WIRE HI TORQ VERSACORE-J 145CM (WIRE) ×1 IMPLANT

## 2021-02-15 NOTE — ED Triage Notes (Signed)
Pt reports felt normal this am.  Around 8:30 she was at work and had sudden onset of pain in center of chest radiating to both arms.  C/O generalized weakness.  Took 2 ibuprofen pta.  Denies any cough, fever, n/v.

## 2021-02-15 NOTE — Progress Notes (Signed)
Patient arrived from cath lab to 4e26. Vital signs obtained and patient with TR band to right radial per report with 63ml of air in it. Site unremarkable at this time. CHG bath complete call bell within reach. Theodora Lalanne, Bettina Gavia rN

## 2021-02-15 NOTE — Progress Notes (Signed)
ANTICOAGULATION CONSULT NOTE - Initial Consult  Pharmacy Consult for heparin Indication: chest pain/ACS  Allergies  Allergen Reactions   Asa [Aspirin]     Told as child she was allergic    Patient Measurements: Height: 5\' 1"  (154.9 cm) Weight: 70.3 kg (155 lb) IBW/kg (Calculated) : 47.8 HW: 63 kg   Vital Signs: Temp: 97.6 F (36.4 C) (10/06 0948) Temp Source: Oral (10/06 0948) BP: 121/83 (10/06 1100) Pulse Rate: 65 (10/06 1100)  Labs: Recent Labs    02/15/21 0947 02/15/21 1132  HGB 17.6*  --   HCT 52.7*  --   PLT 179  --   LABPROT 11.8  --   INR 0.9  --   CREATININE 0.70  --   TROPONINIHS 7 304*    Estimated Creatinine Clearance: 70.4 mL/min (by C-G formula based on SCr of 0.7 mg/dL).   Medical History: Past Medical History:  Diagnosis Date   Bronchitis    Cervical cancer ([place])    Diabetes mellitus without complication ([place])    Hypertension     Medications:  (Not in a hospital admission)   Assessment: Pharmacy consulted to dose heparin in patient with chest pain/ACS.  Patient is not anticoagulation prior to discharge.  CBC WNL Trop 304  Goal of Therapy:  Heparin level 0.3-0.7 units/ml Monitor platelets by anticoagulation protocol: Yes   Plan:  Give 4000 units bolus x 1 Start heparin infusion at 750 units/hr Check anti-Xa level in 6 hours and daily while on heparin Continue to monitor H&H and platelets  Margot Ables, PharmD Clinical Pharmacist 02/15/2021 12:47 PM

## 2021-02-15 NOTE — ED Notes (Addendum)
Pt noted with vomiting following morphine admin. EDP notified and new order for zofran given. Pt denies prior n/v, but states she has had a cough. Reports that she has not taken her BP meds this morning but cant remember name of them. C/O tingling, pain, and weakness down arms bilaterally radiating from central chest, She also endorses mild cough, SOB, and periods of pain on inspiration - EDP notified. Reports no pain at this time following morphine admin.

## 2021-02-15 NOTE — Interval H&P Note (Signed)
Cath Lab Visit (complete for each Cath Lab visit)  Clinical Evaluation Leading to the Procedure:   ACS: Yes.    Non-ACS:    Anginal Classification: CCS II  Anti-ischemic medical therapy: No Therapy  Non-Invasive Test Results: No non-invasive testing performed  Prior CABG: No previous CABG      History and Physical Interval Note:  02/15/2021 3:28 PM  Laura Frost  has presented today for surgery, with the diagnosis of nstemi.  The various methods of treatment have been discussed with the patient and family. After consideration of risks, benefits and other options for treatment, the patient has consented to  Procedure(s): LEFT HEART CATH AND CORONARY ANGIOGRAPHY (N/A) as a surgical intervention.  The patient's history has been reviewed, patient examined, no change in status, stable for surgery.  I have reviewed the patient's chart and labs.  Questions were answered to the patient's satisfaction.     Quay Burow

## 2021-02-15 NOTE — H&P (Signed)
Cardiology Admission History and Physical:   Patient ID: Laura Frost; [id number]; Feb 05, 1966   Admission date: 02/15/2021  Primary Care Provider: Donnie Coffin, MD Primary Cardiologist: New to [facility] Primary Electrophysiologist: None  Chief Complaint: Chest pain  Patient Profile:   Laura Frost is a 55 y.o. female with a history of hypertension, type 2 diabetes mellitus, and longstanding tobacco abuse.  History of Present Illness:   Ms. Agent presents to the [facility] ER via private vehicle for evaluation of chest discomfort.  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  She states that this was moderate to severe in intensity, waxed and waned for at least 30 minutes, she took 2 Advil.  Eventually she asked her boss if she could leave and go be evaluated.  She does not report any previous similar symptoms.  She has a personal history of hypertension, type 2 diabetes mellitus, and longstanding tobacco abuse.  She follows at the [facility].  She states that she takes her medications regularly.  She also reports a history of premature CAD in her father who died suddenly with a heart attack at age 41.  She has not undergone any previous cardiac testing.  She also reports an aspirin allergy, states that she was told never to take aspirin when she was a child, details are not clear.  Question whether she had Reye's syndrome or an anaphylactic allergy.   Past Medical History:  Diagnosis Date   Bronchitis    Cervical cancer ([place])    Hypertension    Type 2 diabetes mellitus ([place])     Past Surgical History:  Procedure Laterality Date   OVARY SURGERY       Medications Prior to Admission: Prior to Admission medications   Medication Sig Start Date End Date Taking? Authorizing Provider   albuterol (PROVENTIL HFA;VENTOLIN HFA) 108 (90 Base) MCG/ACT inhaler Inhale 1-2 puffs into the lungs every 6 (six) hours as needed for wheezing or shortness of breath.   Yes [provider]  lisinopril (PRINIVIL,ZESTRIL) 10 MG tablet Take 10 mg by mouth daily.   Yes [provider]  metFORMIN (GLUCOPHAGE) 500 MG tablet Take 500 mg by mouth 2 (two) times daily with a meal.   Yes [provider]  amoxicillin (AMOXIL) 500 MG capsule Take 1 capsule (500 mg total) by mouth 2 (two) times daily. Patient not taking: No sig reported 02/14/16   Milagros Loll, MD  HYDROcodone-acetaminophen (NORCO/VICODIN) 5-325 MG tablet Take one tab po q 4 hrs prn pain Patient not taking: No sig reported 02/22/19   Triplett, Tammy, PA-C  ibuprofen (ADVIL) 800 MG tablet Take 1 tablet (800 mg total) by mouth 3 (three) times daily. Patient not taking: No sig reported 02/22/19   Triplett, Tammy, PA-C  ondansetron (ZOFRAN ODT) 4 MG disintegrating tablet Take 1 tablet (4 mg total) by mouth every 8 (eight) hours as needed for nausea. Patient not taking: No sig reported 10/19/16   Noemi Chapel, MD  triamcinolone (KENALOG) 0.025 % ointment Apply 1 application topically 2 (two) times daily. Do not apply to face Patient not taking: No sig reported 10/19/16   Noemi Chapel, MD     Allergies:    Allergies  Allergen Reactions   Asa [Aspirin]     Told as child she was allergic  Social History:   Social History   Tobacco Use   Smoking status: Every Day    Packs/day: 0.50    Types: Cigarettes   Smokeless tobacco: Never  Substance Use Topics   Alcohol use: Yes    Comment: Occasional     Family History:   The patient's family history includes Heart attack in her father.    ROS:  Please see the history of present illness.  All other ROS reviewed and negative.     Physical Exam/Data:   Vitals:   02/15/21 1100 02/15/21 1241 02/15/21 1300 02/15/21 1316  BP: 121/83 (!) 160/96 (!) 165/128  (!) 165/128  Pulse: 65 64 67 67  Resp: 15 17 10 18   Temp:      TempSrc:      SpO2: 91% 100% 100% 99%  Weight:      Height:       No intake or output data in the 24 hours ending 02/15/21 1332 Filed Weights   02/15/21 0944  Weight: 70.3 kg   Body mass index is 29.29 kg/m.   Gen: Patient appears comfortable at rest. HEENT: Conjunctiva and lids normal, poor dentition. Neck: Supple, no elevated JVP or carotid bruits, no thyromegaly. Lungs: Clear to auscultation, nonlabored breathing at rest. Cardiac: Regular rate and rhythm, no S3 or significant systolic murmur, no pericardial rub. Abdomen: Soft, nontender, no hepatomegaly, bowel sounds present, no guarding or rebound. Extremities: No pitting edema, distal pulses 2+. Skin: Warm and dry. Musculoskeletal: No kyphosis. Neuropsychiatric: Alert and oriented x3, affect grossly appropriate.  EKG:  An ECG dated 02/15/2021 was personally reviewed today and demonstrated:  Sinus rhythm with increased voltage and poor R wave progression, rule out old anterior infarct pattern.  Nonspecific ST segment changes.  No old tracing for comparison.  Laboratory Data:  Chemistry Recent Labs  Lab 02/15/21 0947  NA 134*  K 4.0  CL 101  CO2 22  GLUCOSE 327*  BUN 9  CREATININE 0.70  CALCIUM 9.1  GFRNONAA >60  ANIONGAP 11    Recent Labs  Lab 02/15/21 0947  PROT 7.0  ALBUMIN 3.9  AST 24  ALT 35  ALKPHOS 92  BILITOT 0.8   Hematology Recent Labs  Lab 02/15/21 0947  WBC 5.5  RBC 5.10  HGB 17.6*  HCT 52.7*  MCV 103.3*  MCH 34.5*  MCHC 33.4  RDW 12.7  PLT 179   Cardiac Enzymes Recent Labs  Lab 02/15/21 0947 02/15/21 1132  TROPONINIHS 7 304*   BNP Recent Labs  Lab 02/15/21 0947  BNP 48.0    DDimer Recent Labs  Lab 02/15/21 0949  DDIMER <0.27    Radiology/Studies:  DG Chest Portable 1 View  Result Date: 02/15/2021 CLINICAL DATA:  Chest pain EXAM: PORTABLE CHEST 1 VIEW COMPARISON:  02/05/2016 FINDINGS: The heart  size and mediastinal contours are within normal limits. Both lungs are clear. The visualized skeletal structures are unremarkable. IMPRESSION: No active disease. Electronically Signed   By: Kerby Moors M.D.   On: 02/15/2021 10:43    Assessment and Plan:   1.  Unstable angina with enzymatic evidence of NSTEMI, symptom onset this morning.  High-sensitivity troponin I levels have increased from 7-3 04.  ECG with poor R wave progression, rule out old anterior infarct pattern with nonspecific ST segment changes.  She has a history of hypertension, type 2 diabetes mellitus, and longstanding tobacco abuse, also family history of premature CAD.  2.  Longstanding tobacco abuse.  We did discuss smoking  cessation.  3.  Uncertain lipid status.  4.  Essential hypertension, on lisinopril as an outpatient.  5.  Reported aspirin allergy, details not clear but told not to take aspirin since being a child.  Question whether she had ryes syndrome or true anaphylactic allergy.  Cannot confirm either.  6.  Type 2 diabetes mellitus, on Glucophage as an outpatient.  Situation discussed with the patient.  Risks and benefits of a diagnostic cardiac catheterization for evaluation of coronary anatomy and possible revascularization options discussed with patient, she is in agreement to proceed.  No aspirin given aspirin allergy, antiplatelet loading can occur in the Cath Lab.  She is on IV heparin, continue lisinopril, hold Glucophage.  Start low-dose beta-blocker, also empiric statin therapy with follow-up FLP.  Eventually candidate for SGLT2 inhibitor.  Signed, Rozann Lesches, MD  02/15/2021 1:32 PM

## 2021-02-15 NOTE — ED Provider Notes (Signed)
[facility] EMERGENCY DEPARTMENT Provider Note   CSN: [id number] Arrival date & time: 02/15/21  0737     History No chief complaint on file.   Laura Frost is a 55 y.o. female.  HPI Patient presents with chest pain.  Onset was severe sudden about 30 minutes prior to ED arrival.  Since onset the pain has been persistent, radiating on both arms with a heavy sensation bilaterally.  No syncope, no vomiting, though there is some nausea, dyspnea.  No relief with anything. Patient has no history of cardiac disease, does have a history of hypertension, hyperlipidemia and is a smoker. She notes that her father died of MI at age 57. No prior cardiac evaluation.  She states that she has been taking her medication as directed.    Past Medical History:  Diagnosis Date   Bronchitis    Cervical cancer ([place])    Diabetes mellitus without complication ([place])    Hypertension     There are no problems to display for this patient.   Past Surgical History:  Procedure Laterality Date   OVARY SURGERY       OB History     Gravida  1   Para      Term      Preterm      AB      Living  1      SAB      IAB      Ectopic      Multiple      Live Births              No family history on file.  Social History   Tobacco Use   Smoking status: Every Day    Packs/day: 0.50    Types: Cigarettes   Smokeless tobacco: Never  Substance Use Topics   Alcohol use: Yes    Comment: occ   Drug use: No    Home Medications Prior to Admission medications   Medication Sig Start Date End Date Taking? Authorizing Provider  albuterol (PROVENTIL HFA;VENTOLIN HFA) 108 (90 Base) MCG/ACT inhaler Inhale 1-2 puffs into the lungs every 6 (six) hours as needed for wheezing or shortness of breath.    [provider]  amoxicillin (AMOXIL) 500 MG capsule Take 1 capsule (500 mg total) by mouth 2 (two) times daily. Patient not taking: Reported on 10/19/2016 02/14/16   Milagros Loll, MD   guaifenesin (ROBITUSSIN) 100 MG/5ML syrup Take 200 mg by mouth 3 (three) times daily as needed for cough.    [provider]  HYDROcodone-acetaminophen (NORCO/VICODIN) 5-325 MG tablet Take one tab po q 4 hrs prn pain 02/22/19   Triplett, Tammy, PA-C  ibuprofen (ADVIL) 800 MG tablet Take 1 tablet (800 mg total) by mouth 3 (three) times daily. 02/22/19   Triplett, Tammy, PA-C  lisinopril (PRINIVIL,ZESTRIL) 10 MG tablet Take 10 mg by mouth daily.    [provider]  ondansetron (ZOFRAN ODT) 4 MG disintegrating tablet Take 1 tablet (4 mg total) by mouth every 8 (eight) hours as needed for nausea. 10/19/16   Noemi Chapel, MD  triamcinolone (KENALOG) 0.025 % ointment Apply 1 application topically 2 (two) times daily. Do not apply to face 10/19/16   Noemi Chapel, MD    Allergies    Diona Fanti [aspirin]  Review of Systems   Review of Systems  Constitutional:        Per HPI, otherwise negative  HENT:         Per  HPI, otherwise negative  Respiratory:         Per HPI, otherwise negative  Cardiovascular:        Per HPI, otherwise negative  Gastrointestinal:  Negative for vomiting.  Endocrine:       Negative aside from HPI  Genitourinary:        Neg aside from HPI   Musculoskeletal:        Per HPI, otherwise negative  Skin: Negative.   Neurological:  Negative for syncope.   Physical Exam Updated Vital Signs BP (!) 160/96   Pulse 64   Temp 97.6 F (36.4 C) (Oral)   Resp 17   Ht 5\' 1"  (1.549 m)   Wt 70.3 kg   SpO2 100%   BMI 29.29 kg/m   Physical Exam Vitals and nursing note reviewed.  Constitutional:      Appearance: She is well-developed. She is ill-appearing and diaphoretic.  HENT:     Head: Normocephalic and atraumatic.  Eyes:     Conjunctiva/sclera: Conjunctivae normal.  Cardiovascular:     Rate and Rhythm: Normal rate and regular rhythm.     Pulses: Normal pulses.     Comments: Pulses symmetric bilaterally. Pulmonary:     Effort: Pulmonary effort is normal.  No respiratory distress.     Breath sounds: Normal breath sounds. No stridor.  Abdominal:     General: There is no distension.  Skin:    General: Skin is warm.  Neurological:     Mental Status: She is alert and oriented to person, place, and time.     Cranial Nerves: No cranial nerve deficit.    ED Results / Procedures / Treatments   Labs (all labs ordered are listed, but only abnormal results are displayed) Labs Reviewed  COMPREHENSIVE METABOLIC PANEL - Abnormal; Notable for the following components:      Result Value   Sodium 134 (*)    Glucose, Bld 327 (*)    All other components within normal limits  CBC WITH DIFFERENTIAL/PLATELET - Abnormal; Notable for the following components:   Hemoglobin 17.6 (*)    HCT 52.7 (*)    MCV 103.3 (*)    MCH 34.5 (*)    All other components within normal limits  CBG MONITORING, ED - Abnormal; Notable for the following components:   Glucose-Capillary 322 (*)    All other components within normal limits  TROPONIN I (HIGH SENSITIVITY) - Abnormal; Notable for the following components:   Troponin I (High Sensitivity) 304 (*)    All other components within normal limits  RESP PANEL BY RT-PCR (FLU A&B, COVID) ARPGX2  MAGNESIUM  BRAIN NATRIURETIC PEPTIDE  PROTIME-INR  D-DIMER, QUANTITATIVE  POC URINE PREG, ED  TROPONIN I (HIGH SENSITIVITY)    EKG#1 EKG Interpretation  Date/Time:  Thursday February 15 2021 09:46:11 EDT Ventricular Rate:  83 PR Interval:  162 QRS Duration: 89 QT Interval:  396 QTC Calculation: 466 R Axis:   -35 Text Interpretation: Sinus rhythm Left axis deviation Anteroseptal infarct, old ST-t wave abnormality Abnormal ECG Confirmed by Carmin Muskrat [phone]) on 02/15/2021 10:00:35 AM    Radiology DG Chest Portable 1 View  Result Date: 02/15/2021 CLINICAL DATA:  Chest pain EXAM: PORTABLE CHEST 1 VIEW COMPARISON:  02/05/2016 FINDINGS: The heart size and mediastinal contours are within normal limits. Both lungs are  clear. The visualized skeletal structures are unremarkable. IMPRESSION: No active disease. Electronically Signed   By: Kerby Moors M.D.   On: 02/15/2021 10:43  Procedures Procedures   Medications Ordered in ED Medications  heparin bolus via infusion 4,000 Units (has no administration in time range)  heparin ADULT infusion 100 units/mL (25000 units/268mL) (has no administration in time range)  morphine 4 MG/ML injection 4 mg (4 mg Intravenous Given 02/15/21 1002)  ondansetron (ZOFRAN) injection 4 mg (4 mg Intravenous Given 02/15/21 1009)  nitroGLYCERIN (NITROSTAT) SL tablet 0.4 mg (0.4 mg Sublingual Given 02/15/21 1023)    ED Course  I have reviewed the triage vital signs and the nursing notes.  Pertinent labs & imaging results that were available during my care of the patient were reviewed by me and considered in my medical decision making (see chart for details).  Patient placed on continuous cardiac monitoring, pulse oximetry, had expeditious evaluation given acute onset chest pain, risk factors. On the cardiac monitor heart rate 70s, sinus normal Pulse ox 95% room air borderline After the initial evaluation I discussed her EKG with her cardiology colleague.  Patient does not meet STEMI criteria.  Update: Blood pressure markedly better after nitroglycerin, morphine, Zofran.  1:44 PM Patient chest pain-free, blood pressure remains substantially better.  Second troponin elevated, 300.  Again discussed her case with our cardiology team, Dr. Domenic Polite.  Patient will start heparin, will be admitted for further monitoring, management for NSTEMI. MDM Rules/Calculators/A&P MDM Number of Diagnoses or Management Options NSTEMI (non-ST elevated myocardial infarction) [facility]): new, needed workup   Amount and/or Complexity of Data Reviewed Clinical lab tests: ordered and reviewed Tests in the radiology section of CPT: ordered and reviewed Tests in the medicine section of CPT: reviewed and  ordered Decide to obtain previous medical records or to obtain history from someone other than the patient: yes Review and summarize past medical records: yes Discuss the patient with other providers: yes Independent visualization of images, tracings, or specimens: yes  Risk of Complications, Morbidity, and/or Mortality Presenting problems: high Diagnostic procedures: high Management options: high  Critical Care Total time providing critical care: 30-74 minutes (40)  Patient Progress Patient progress: improved   Final Clinical Impression(s) / ED Diagnoses Final diagnoses:  NSTEMI (non-ST elevated myocardial infarction) ([place])     Carmin Muskrat, MD 02/15/21 1346

## 2021-02-16 ENCOUNTER — Ambulatory Visit (HOSPITAL_BASED_OUTPATIENT_CLINIC_OR_DEPARTMENT_OTHER): Payer: Self-pay

## 2021-02-16 ENCOUNTER — Other Ambulatory Visit (HOSPITAL_COMMUNITY): Payer: Self-pay

## 2021-02-16 ENCOUNTER — Encounter (HOSPITAL_COMMUNITY): Payer: Self-pay | Admitting: Cardiovascular Disease

## 2021-02-16 ENCOUNTER — Other Ambulatory Visit: Payer: Self-pay | Admitting: Medical

## 2021-02-16 DIAGNOSIS — I2510 Atherosclerotic heart disease of native coronary artery without angina pectoris: Secondary | ICD-10-CM

## 2021-02-16 DIAGNOSIS — Z720 Tobacco use: Secondary | ICD-10-CM

## 2021-02-16 DIAGNOSIS — I10 Essential (primary) hypertension: Secondary | ICD-10-CM

## 2021-02-16 DIAGNOSIS — E119 Type 2 diabetes mellitus without complications: Secondary | ICD-10-CM

## 2021-02-16 LAB — HEMOGLOBIN A1C
Hgb A1c MFr Bld: 9.1 % — ABNORMAL HIGH (ref 4.8–5.6)
Mean Plasma Glucose: 214.47 mg/dL

## 2021-02-16 LAB — CBC
HCT: 45.7 % (ref 36.0–46.0)
Hemoglobin: 15.3 g/dL — ABNORMAL HIGH (ref 12.0–15.0)
MCH: 34.5 pg — ABNORMAL HIGH (ref 26.0–34.0)
MCHC: 33.5 g/dL (ref 30.0–36.0)
MCV: 103.2 fL — ABNORMAL HIGH (ref 80.0–100.0)
Platelets: 158 10*3/uL (ref 150–400)
RBC: 4.43 MIL/uL (ref 3.87–5.11)
RDW: 12.6 % (ref 11.5–15.5)
WBC: 6.1 10*3/uL (ref 4.0–10.5)
nRBC: 0 % (ref 0.0–0.2)

## 2021-02-16 LAB — ECHOCARDIOGRAM COMPLETE
Area-P 1/2: 2.11 cm2
Height: 61 in
S' Lateral: 2.8 cm
Weight: 2807.78 oz

## 2021-02-16 LAB — LIPID PANEL
Cholesterol: 148 mg/dL (ref 0–200)
HDL: 36 mg/dL — ABNORMAL LOW (ref >40)
LDL Cholesterol: 69 mg/dL (ref 0–99)
Total CHOL/HDL Ratio: 4.1 RATIO
Triglycerides: 216 mg/dL — ABNORMAL HIGH (ref <150)
VLDL: 43 mg/dL — ABNORMAL HIGH (ref 0–40)

## 2021-02-16 LAB — BASIC METABOLIC PANEL
Anion gap: 9 (ref 5–15)
BUN: 6 mg/dL (ref 6–20)
CO2: 25 mmol/L (ref 22–32)
Calcium: 8.7 mg/dL — ABNORMAL LOW (ref 8.9–10.3)
Chloride: 102 mmol/L (ref 98–111)
Creatinine, Ser: 0.61 mg/dL (ref 0.44–1.00)
GFR, Estimated: >60 mL/min (ref >60)
Glucose, Bld: 335 mg/dL — ABNORMAL HIGH (ref 70–99)
Potassium: 3.8 mmol/L (ref 3.5–5.1)
Sodium: 136 mmol/L (ref 135–145)

## 2021-02-16 LAB — GLUCOSE, CAPILLARY
Glucose-Capillary: 267 mg/dL — ABNORMAL HIGH (ref 70–99)
Glucose-Capillary: 149 mg/dL — ABNORMAL HIGH (ref 70–99)
Glucose-Capillary: 230 mg/dL — ABNORMAL HIGH (ref 70–99)

## 2021-02-16 MED ORDER — CLOPIDOGREL BISULFATE 75 MG PO TABS
75.0000 mg | ORAL_TABLET | Freq: Every day | ORAL | Status: DC
  Administered 2021-02-16: 75 mg via ORAL
  Filled 2021-02-16: qty 1

## 2021-02-16 MED ORDER — NITROGLYCERIN 0.4 MG SL SUBL
0.4000 mg | SUBLINGUAL_TABLET | SUBLINGUAL | 3 refills | Status: DC | PRN
  Filled 2021-02-16: qty 25, 7d supply, fill #0

## 2021-02-16 MED ORDER — EMPAGLIFLOZIN 10 MG PO TABS
10.0000 mg | ORAL_TABLET | Freq: Every day | ORAL | 6 refills | Status: DC
  Filled 2021-02-16: qty 14, 14d supply, fill #0

## 2021-02-16 MED ORDER — CLOPIDOGREL BISULFATE 75 MG PO TABS
75.0000 mg | ORAL_TABLET | Freq: Every day | ORAL | 3 refills | Status: DC
  Filled 2021-02-16: qty 30, 30d supply, fill #0

## 2021-02-16 MED ORDER — ATORVASTATIN CALCIUM 40 MG PO TABS
40.0000 mg | ORAL_TABLET | Freq: Every day | ORAL | Status: DC
  Administered 2021-02-16: 40 mg via ORAL
  Filled 2021-02-16: qty 1

## 2021-02-16 MED ORDER — METOPROLOL TARTRATE 25 MG PO TABS
12.5000 mg | ORAL_TABLET | Freq: Two times a day (BID) | ORAL | 3 refills | Status: DC
  Filled 2021-02-16: qty 30, 30d supply, fill #0

## 2021-02-16 MED ORDER — ATORVASTATIN CALCIUM 40 MG PO TABS
40.0000 mg | ORAL_TABLET | Freq: Every day | ORAL | 3 refills | Status: DC
  Filled 2021-02-16: qty 30, 30d supply, fill #0

## 2021-02-16 NOTE — Progress Notes (Signed)
Pt to be discharged from 4E to home. D/c instructions and medication education provided. Telemetry and Ivs removed.   Raelyn Number, RN

## 2021-02-16 NOTE — Progress Notes (Addendum)
Inpatient Diabetes Program Recommendations  AACE/ADA: New Consensus Statement on Inpatient Glycemic Control (2015)  Target Ranges:  Prepandial:   less than 140 mg/dL      Peak postprandial:   less than 180 mg/dL (1-2 hours)      Critically ill patients:  140 - 180 mg/dL  Results for CRYSTLE, CARELLI (MRN [id number]) as of 02/16/2021 09:17  Ref. Range 02/15/2021 10:02 02/15/2021 16:46 02/15/2021 21:24  Glucose-Capillary Latest Ref Range: 70 - 99 mg/dL 322 (H) 212 (H)  5 units Novolog  293 (H)  Results for DELITHA, ELMS (MRN [id number]) as of 02/16/2021 09:17  Ref. Range 02/16/2021 06:10  Glucose-Capillary Latest Ref Range: 70 - 99 mg/dL 267 (H)  8 units Novolog  Results for LAQUANTA, HUMMEL (MRN [id number]) as of 02/16/2021 09:17  Ref. Range 02/16/2021 01:40  Hemoglobin A1C Latest Ref Range: 4.8 - 5.6 % 9.1 (H)  (214 mg/dl)    Admit with: Unstable angina with enzymatic evidence of NSTEMI  History: DM2, Tobacco Abuse  Home DM Meds: Metformin 500 mg BID  Current Orders: Novolog Moderate Correction Scale/ SSI (0-15 units) TID AC   PCP: [facility] Dept  Cardiac cath last PM   MD- Given A1c of 9.1%, likely needs adjunct therapy at home to better control CBGs.  Unsure if pt will be able to afford SGLT-2 medication as pt listed as uninsured.  Will consult TOC team to see if SGLT-2 meds covered by the [facility] health dept.   Addendum 11:50am--Spoke with Hassan Rowan from [facility] team.  Pt can get a free 14-day trial of Jardiance with coupon from [facility] team and then can get Jardiance for $10 at the [facility] through the [place] if she brings 1 month of paystubs to her next appt on 02/19/2021 at the health dept.  Addendum 12:10pm--Spoke w/ pt by phone (This Diabetes RN working remotely today).  Pt told me she has trouble affording her meds.  Has not been taking the Metformin for 1 month due to inability to afford.  Has CBG meter but the meter is currently in her  storage unit and she feels like she cannot make the effort to go get it right now.  Has also been eating poorly--Lots of fried foods, carbohydrates, and has been drinking 1 two liter of regular soda every 2 days.   Spoke with patient about her current A1c of 9.1%.  Explained what an A1c is and what it measures.  Reminded patient that her goal A1c is 7% or less per ADA standards to prevent both acute and long-term complications.  Explained to patient the extreme importance of good glucose control at home.  Encouraged patient to check her CBGs at least daily at home (either QAM or before another meal of the day) and to record all CBGs in a logbook for her PCP to review. Discussed with pt that she needs to restart the Metformin and work on her nutrition.  Encouraged patient to avoid beverages with sugar (regular soda, sweet tea, lemonade, fruit juice) and to consume mostly water.  Strongly encouraged pt to stop drinking the 2 liters of regular soda and switch to baked or grilled foods--also encouraged smaller portions of carbohydrates and frozen veggies over canned veggies.  Discussed what foods contain carbohydrates and how carbohydrates affect the body's blood sugar levels.  Encouraged patient to be careful with her portion sizes (especially grains, starchy vegetables, and fruits).  Explained to patient that women should have 45-60 grams of carbohydrates  per meal per day. Patient very appreciative of call and told me she is motivated to make necessary changes.  Reminded pt to take her 1 month of paystubs to her next appt at the health dept so she can continue to get help with her meds.     --Will follow patient during hospitalization--  Wyn Quaker RN, MSN, CDE Diabetes Coordinator Inpatient Glycemic Control Team Team Pager: [phone] (8a-5p)

## 2021-02-16 NOTE — Progress Notes (Signed)
Progress Note  Patient Name: Laura Frost Date of Encounter: 02/16/2021  [facility] Cardiologist: None   Subjective   Denies any chest pain or dyspnea  Inpatient Medications    Scheduled Meds:  atorvastatin  80 mg Oral q1800   insulin aspart  0-15 Units Subcutaneous TID WC   lisinopril  10 mg Oral Daily   metoprolol tartrate  12.5 mg Oral BID   sodium chloride flush  3 mL Intravenous Q12H   Continuous Infusions:  sodium chloride Stopped (02/15/21 1800)   sodium chloride     PRN Meds: sodium chloride, acetaminophen, albuterol, diphenhydrAMINE, morphine injection, nitroGLYCERIN, ondansetron (ZOFRAN) IV, sodium chloride flush   Vital Signs    Vitals:   02/15/21 2305 02/16/21 0357 02/16/21 0455 02/16/21 0805  BP: 109/63 (!) 148/90  (!) 131/93  Pulse:  (!) 57  63  Resp: 17 14  15   Temp: 98 F (36.7 C) 98 F (36.7 C)  97.8 F (36.6 C)  TempSrc: Oral Oral  Oral  SpO2: 96% 97%  100%  Weight:   79.6 kg   Height:        Intake/Output Summary (Last 24 hours) at 02/16/2021 1029 Last data filed at 02/15/2021 2210 Gross per 24 hour  Intake 753.7 ml  Output --  Net 753.7 ml   Last 3 Weights 02/16/2021 02/15/2021 02/15/2021  Weight (lbs) 175 lb 7.8 oz 175 lb 4.3 oz 155 lb  Weight (kg) 79.6 kg 79.5 kg 70.308 kg      Telemetry    NSR 50-60s - Personally Reviewed  ECG     No new ECG- Personally Reviewed  Physical Exam   GEN: No acute distress.   Neck: No JVD Cardiac: RRR, no murmurs, rubs, or gallops.  Respiratory: Clear to auscultation bilaterally. GI: Soft, nontender, non-distended  MS: No edema; No deformity. Neuro:  Nonfocal  Psych: Normal affect   Labs    High Sensitivity Troponin:   Recent Labs  Lab 02/15/21 0947 02/15/21 1132  TROPONINIHS 7 304*     Chemistry Recent Labs  Lab 02/15/21 0947 02/16/21 0140  NA 134* 136  K 4.0 3.8  CL 101 102  CO2 22 25  GLUCOSE 327* 335*  BUN 9 6  CREATININE 0.70 0.61  CALCIUM 9.1 8.7*  MG 1.7  --    PROT 7.0  --   ALBUMIN 3.9  --   AST 24  --   ALT 35  --   ALKPHOS 92  --   BILITOT 0.8  --   GFRNONAA >60 >60  ANIONGAP 11 9    Lipids  Recent Labs  Lab 02/16/21 0140  CHOL 148  TRIG 216*  HDL 36*  LDLCALC 69  CHOLHDL 4.1    Hematology Recent Labs  Lab 02/15/21 0947 02/16/21 0140  WBC 5.5 6.1  RBC 5.10 4.43  HGB 17.6* 15.3*  HCT 52.7* 45.7  MCV 103.3* 103.2*  MCH 34.5* 34.5*  MCHC 33.4 33.5  RDW 12.7 12.6  PLT 179 158   Thyroid No results for input(s): TSH, FREET4 in the last 168 hours.  BNP Recent Labs  Lab 02/15/21 0947  BNP 48.0    DDimer  Recent Labs  Lab 02/15/21 0949  DDIMER <0.27     Radiology    CARDIAC CATHETERIZATION  Result Date: 02/15/2021 Images from the original result were not included.   RPAV lesion is 80% stenosed.   1st RPL lesion is 80% stenosed.   The left ventricular systolic function  is normal.   The left ventricular ejection fraction is 55-65% by visual estimate. Laura Frost is a 55 y.o. female  [id number] LOCATION:  FACILITY: [facility] PHYSICIAN: Laura Frost, M.D. May 12, 1966 DATE OF PROCEDURE:  02/15/2021 DATE OF DISCHARGE: CARDIAC CATHETERIZATION History obtained from chart review. Laura Frost is a 55 y.o. female with a history of hypertension, type 2 diabetes mellitus, and longstanding tobacco abuse who was seen by Dr. Domenic Polite at [facility] with chest pain.  Troponins went up to over 300.  EKG showed no acute changes.  She was heparinized and transferred to [facility] for diagnostic coronary angiography.   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  She is aspirin allergic and may benefit from being on a P2 Y 12 inhibitor such as Plavix.  She will need cardiac risk factor modification including smoking cessation and treatment of diabetes and hyperlipidemia.  The hip will be discontinued.  The sheath was removed and a TR band was placed  on the right wrist to achieve patent hemostasis.  The patient left the lab in stable condition. Laura Frost. MD, [facility] 02/15/2021 4:14 PM    DG Chest Portable 1 View  Result Date: 02/15/2021 CLINICAL DATA:  Chest pain EXAM: PORTABLE CHEST 1 VIEW COMPARISON:  02/05/2016 FINDINGS: The heart size and mediastinal contours are within normal limits. Both lungs are clear. The visualized skeletal structures are unremarkable. IMPRESSION: No active disease. Electronically Signed   By: Kerby Moors M.D.   On: 02/15/2021 10:43    Cardiac Studies   Cath 10/6:   RPAV lesion is 80% stenosed.   1st RPL lesion is 80% stenosed.   The left ventricular systolic function is normal.   The left ventricular ejection fraction is 55-65% by visual estimate.  Patient Profile     55 y.o. female with a history of hypertension, type 2 diabetes mellitus, and longstanding tobacco abuse who presents with NSTEMI  Assessment & Plan    NSTEMI: symptom onset  10/6. High-sensitivity troponin I levels increased from 7>304.  ECG with poor R wave progression, rule out old anterior infarct pattern with nonspecific ST segment changes.  She has a history of hypertension, type 2 diabetes mellitus, and longstanding tobacco abuse, also family history of premature CAD.  Cath on 10/6 showed distal posterior lateral branch disease but otherwise patent arteries and normal LV function -Has allergy to aspirin, will start Plavix 75 mg daily -Continue metoprolol 12.5 mg twice daily -Continue atorvastatin, LDL 69    Longstanding tobacco abuse.  Smoking cessation strongly recommended   Essential hypertension: continue lisinopril, metoprolol   Reported aspirin allergy, details not clear but told not to take aspirin since being a child.  Question whether she had ryes syndrome or true anaphylactic allergy.  Cannot confirm either.  Will start plavix as above   Type 2 diabetes mellitus, on Glucophage as an outpatient.  Disposition: F/u  echocardiogram today, potential discharge today if no significant abnormalities  For questions or updates, please contact [place] Please consult [email or web address] for contact info under        Signed, Donato Heinz, MD  02/16/2021, 10:29 AM

## 2021-02-16 NOTE — TOC Initial Note (Addendum)
Transition of Care [facility]) - Initial/Assessment Note    Patient Details  Name: Laura Frost MRN: [id number] Date of Birth: 1966/05/06  [facility]) CM/SW Contact:    Bethena Roys, RN Phone Number: 02/16/2021, 11:36 AM  Clinical Narrative: Case Manager received a consult for medication assistance. Patient is previously only working 25-30 hours a week and she does not have insurance. Patient states she has been to the [facility] in the past and would like an appointment scheduled. Case Manager did schedule an appointment at the [facility] placed on the AVS. Patient will have the opportunity to speak with Lynn Ito that assists patients with medication needs. The consult was for Antonietta Barcelona, or Wilder Glade to see if the [facility] could assist the patient. Case Manager did speak with Marcie Bal at the Health Dept and the patient will need to bring one check stub to become eligible for the [facility] card will be issued-only for certain medications.  However, if the patient is discharged on Jardiance (14 day free card is available to provide to the patient), the patient will need to either bring previously filed tax forms, W-2, or one month of pay stubs to her appointment to be qualified for the Rx assistance program with Marcie Bal. Case Manager did speak with the Diabetes Educator, and she will speak with cardiology. If patient is transitioned home today. MATCH can be completed and medications will need to be sent to [facility].   1325 02-16-21 MATCH completed for this patient. [place] to deliver medications to the room prior to d/c.  Expected Discharge Plan: Home/Self Care Barriers to Discharge: No Barriers Identified   Patient Goals and CMS Choice Patient states their goals for this hospitalization and ongoing recovery are:: to retun home      Expected Discharge Plan and Services Expected Discharge Plan: Home/Self Care      Prior Living Arrangements/Services  Patient language and need for interpreter reviewed:: Yes Do you feel safe going back to the place where you live?: Yes      Need for Family Participation in Patient Care: No (Comment) Care giver support system in place?: No (comment)   Criminal Activity/Legal Involvement Pertinent to Current Situation/Hospitalization: No - Comment as needed  Activities of Daily Living Home Assistive Devices/Equipment: Eyeglasses ADL Screening (condition at time of admission) Patient's cognitive ability adequate to safely complete daily activities?: Yes Is the patient deaf or have difficulty hearing?: No Does the patient have difficulty seeing, even when wearing glasses/contacts?: No Does the patient have difficulty concentrating, remembering, or making decisions?: No Patient able to express need for assistance with ADLs?: Yes Does the patient have difficulty dressing or bathing?: No Independently performs ADLs?: Yes (appropriate for developmental age) Does the patient have difficulty walking or climbing stairs?: No Weakness of Legs: None Weakness of Arms/Hands: None  Permission Sought/Granted Permission sought to share information with : Case Manager Permission granted to share information with : Yes, Verbal Permission Granted     Permission granted to share info w AGENCY: [facility] Department       Emotional Assessment  Orientation: : Oriented to Self, Oriented to Situation, Oriented to Place, Oriented to  Time Alcohol / Substance Use: Not Applicable Psych Involvement: No (comment)  Admission diagnosis:  NSTEMI (non-ST elevated myocardial infarction) [facility]) [I21.4] Patient Active Problem List   Diagnosis Date Noted   NSTEMI (non-ST elevated myocardial infarction) ([place]) 02/15/2021   PCP:  Donnie Coffin, MD Pharmacy:  [id number] - [place], [place] - [address] [address] Phone:  [phone] Fax: [phone]   Readmission Risk Interventions No flowsheet data found.

## 2021-02-16 NOTE — Discharge Instructions (Signed)
PLEASE REMEMBER TO BRING ALL OF YOUR MEDICATIONS TO EACH OF YOUR FOLLOW-UP OFFICE VISITS.  PLEASE ATTEND ALL SCHEDULED FOLLOW-UP APPOINTMENTS.   Activity: Increase activity slowly as tolerated. You may shower, but no soaking baths (or swimming) for 1 week. No driving for 24 hours. No lifting over 5 lbs for 1 week. No sexual activity for 1 week.   You May Return to Work: in 1 week (if applicable)  Wound Care: You may wash cath site gently with soap and water. Keep cath site clean and dry. If you notice pain, swelling, bleeding or pus at your cath site, please call [phone].    You will be able to get Jardiance for $10 at the [facility]. Please bring 1 month of pay stubs to your next appt which is on Monday 10/10.  You can restart your metformin 02/18/21 as previously prescribed - we delay this medication following heart catheterization procedures to minimize injury to your kidneys

## 2021-02-16 NOTE — Discharge Summary (Addendum)
Discharge Summary    Patient ID: Laura Frost MRN: [id number]; DOB: November 25, 1965  Admit date: 02/15/2021 Discharge date: 02/16/2021  PCP:  Donnie Coffin, MD   [facility] Providers Cardiologist:  Rozann Lesches, MD      Discharge Diagnoses    Principal Problem:   NSTEMI (non-ST elevated myocardial infarction) [facility]) Active Problems:   Hypertension   Coronary artery disease   DM type 2 (diabetes mellitus, type 2) ([place])   Tobacco abuse    Diagnostic Studies/Procedures    LHC 02/15/21:   RPAV lesion is 80% stenosed.   1st RPL lesion is 80% stenosed.   The left ventricular systolic function is normal.   The left ventricular ejection fraction is 55-65% by visual estimate.  IMPRESSION: Mr. Boehlke has distal posterolateral branch disease but otherwise widely patent epicardial arteries and normal LV function.  She is aspirin allergic and may benefit from being on a P2 Y 12 inhibitor such as Plavix.  She will need cardiac risk factor modification including smoking cessation and treatment of diabetes and hyperlipidemia.  The hip will be discontinued.  The sheath was removed and a TR band was placed on the right wrist to achieve patent hemostasis.  The patient left the lab in stable condition.   Diagnostic Dominance: Right  Echocardigram 02/16/21: 1. Left ventricular ejection fraction, by estimation, is 60 to 65%. The  left ventricle has normal function. The left ventricle has no regional  wall motion abnormalities. There is mild left ventricular hypertrophy.  Left ventricular diastolic parameters  are consistent with Grade I diastolic dysfunction (impaired relaxation).  Elevated left atrial pressure.   2. Right ventricular systolic function is normal. The right ventricular  size is normal.   3. The mitral valve is normal in structure. Trivial mitral valve  regurgitation. No evidence of mitral stenosis.   4. The aortic valve is normal in structure. Aortic valve regurgitation is   trivial. No aortic stenosis is present.   5. The inferior vena cava is normal in size with greater than 50%  respiratory variability, suggesting right atrial pressure of 3 mmHg.  _____________   History of Present Illness     Laura Frost is a 55 y.o. female with a PMH of HTN, DM type 2, and longstanding tobacco abuse who presented to [facility] for the evaluation of chest pain. 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  She states that this was moderate to severe in intensity, waxed and waned for at least 30 minutes, she took 2 Advil.  Eventually she asked her boss if she could leave and go be evaluated.  She does not report any previous similar symptoms.   She has a personal history of hypertension, type 2 diabetes mellitus, and longstanding tobacco abuse.  She follows at the [facility].  She states that she takes her medications regularly.  She also reports a history of premature CAD in her father who died suddenly with a heart attack at age 65.   She has not undergone any previous cardiac testing.   She also reports an aspirin allergy, states that she was told never to take aspirin when she was a child, details are not clear.  Question whether she had Reye's syndrome or an anaphylactic allergy.  Hospital Course     Consultants: None  1. NSTEMI: patient presented with CP. HsStrop 7>304. EKG with poor R wave progression, possible old anterior infarct pattern with non-specific ST abnormalities. Given history of HTN, DM type 2, longstanding tobacco abuse, and family history of premature CAD she was taken to the cath lab for ischemic evaluation. LHC 02/15/21 showed 80% RPAV and 1st RPL stenosis, otherwise widely patent coronary arteries. She noted allergy to aspirin since childhood (unclear if  Reyes syndrom vs true anaphylactic allergy). She was recommended for medical management and started on plavix 75mg  daily, atorvastatin 40mg  daily, and metoprolol tartrate 12.5mg  BID. Echo showed EF 60-65%, G1DD, no RWMA, and no significant valvular abnormalities.  - Continue plavix and statin (will need repeat FLP/LFTs in 6-8 weeks for routine monitoring) - Continue Bblocker  2. HTN: BP stable. Started on metoprolol this admission home lisinopril - Continue metoprolol and lisinopril  3. DM type 2: A1C 9.1 this admission. Home metformin held  - Recommend restarting metformin 02/18/21 - Given A1C above goal she was started on jardiance 10mg  daily.   4. HLD: LDL 69 this admission. She was started on atorvastatin 40mg  daily - Continue atorvastatin - Will need repeat FLP/LFTs in 6-8 weeks   5. Tobacco abuse:  - Smoking cessation encouraged.   Did the patient have an acute coronary syndrome (MI, NSTEMI, STEMI, etc) this admission?:  Yes                               AHA/ACC Clinical Performance & Quality Measures: Aspirin prescribed? - No - allergy to aspirin ADP Receptor Inhibitor (Plavix/Clopidogrel, Brilinta/Ticagrelor or Effient/Prasugrel) prescribed (includes medically managed patients)? - Yes Beta Blocker prescribed? - Yes High Intensity Statin (Lipitor 40-80mg  or Crestor 20-40mg ) prescribed? - Yes EF assessed during THIS hospitalization? - Yes For EF <40%, was ACEI/ARB prescribed? - Yes For EF <40%, Aldosterone Antagonist (Spironolactone or Eplerenone) prescribed? - Not Applicable (EF >/= 95%) Cardiac Rehab Phase II ordered (including medically managed patients)? - Yes      _____________  Discharge Vitals Blood pressure (!) 163/94, pulse 63, temperature 97.6 F (36.4 C), temperature source Oral, resp. rate 17, height 5\' 1"  (1.549 m), weight 79.6 kg, SpO2 98 %.  Filed Weights   02/15/21 0944 02/15/21 1636 02/16/21 0455  Weight: 70.3 kg 79.5 kg 79.6 kg    Labs & Radiologic  Studies    CBC Recent Labs    02/15/21 0947 02/16/21 0140  WBC 5.5 6.1  NEUTROABS 2.4  --   HGB 17.6* 15.3*  HCT 52.7* 45.7  MCV 103.3* 103.2*  PLT 179 621   Basic Metabolic Panel Recent Labs    02/15/21 0947 02/16/21 0140  NA 134* 136  K 4.0 3.8  CL 101 102  CO2 22 25  GLUCOSE 327* 335*  BUN 9 6  CREATININE 0.70 0.61  CALCIUM 9.1 8.7*  MG 1.7  --    Liver Function Tests Recent Labs    02/15/21 0947  AST 24  ALT 35  ALKPHOS 92  BILITOT 0.8  PROT 7.0  ALBUMIN 3.9   No results for input(s): LIPASE, AMYLASE in the last 72 hours. High Sensitivity Troponin:   Recent Labs  Lab 02/15/21 0947 02/15/21 1132  TROPONINIHS 7 304*    BNP Invalid input(s): POCBNP D-Dimer Recent Labs    02/15/21 0949  DDIMER <0.27   Hemoglobin A1C Recent Labs    02/16/21 0140  HGBA1C 9.1*   Fasting Lipid Panel Recent Labs  02/16/21 0140  CHOL 148  HDL 36*  LDLCALC 69  TRIG 216*  CHOLHDL 4.1   Thyroid Function Tests No results for input(s): TSH, T4TOTAL, T3FREE, THYROIDAB in the last 72 hours.  Invalid input(s): FREET3 _____________  CARDIAC CATHETERIZATION  Result Date: 02/15/2021 Images from the original result were not included.   RPAV lesion is 80% stenosed.   1st RPL lesion is 80% stenosed.   The left ventricular systolic function is normal.   The left ventricular ejection fraction is 55-65% by visual estimate. Cydnee Zilberman is a 55 y.o. female  [id number] LOCATION:  FACILITY: [facility] PHYSICIAN: Quay Burow, M.D. May 22, 1965 DATE OF PROCEDURE:  02/15/2021 DATE OF DISCHARGE: CARDIAC CATHETERIZATION History obtained from chart review. Lareen Bowery is a 55 y.o. female with a history of hypertension, type 2 diabetes mellitus, and longstanding tobacco abuse who was seen by Dr. Domenic Polite at [facility] with chest pain.  Troponins went up to over 300.  EKG showed no acute changes.  She was heparinized and transferred to [facility] for diagnostic coronary  angiography.   Mr. Conway has distal posterolateral branch disease but otherwise widely patent epicardial arteries and normal LV function.  She is aspirin allergic and may benefit from being on a P2 Y 12 inhibitor such as Plavix.  She will need cardiac risk factor modification including smoking cessation and treatment of diabetes and hyperlipidemia.  The hip will be discontinued.  The sheath was removed and a TR band was placed on the right wrist to achieve patent hemostasis.  The patient left the lab in stable condition. Quay Burow. MD, [facility] 02/15/2021 4:14 PM    DG Chest Portable 1 View  Result Date: 02/15/2021 CLINICAL DATA:  Chest pain EXAM: PORTABLE CHEST 1 VIEW COMPARISON:  02/05/2016 FINDINGS: The heart size and mediastinal contours are within normal limits. Both lungs are clear. The visualized skeletal structures are unremarkable. IMPRESSION: No active disease. Electronically Signed   By: Kerby Moors M.D.   On: 02/15/2021 10:43   Disposition   Pt is being discharged home today in good condition.  Follow-up Plans & [place], [facility] Follow up on 32/95/1884.   Why: @ 11:00 am with Marella Bile- please arrive 15 minutes early and arrive alone, please wear a mask, Bring proof of income and new medication list. If you cannot make this scheduled apppontment please call the office to reschedule. Contact information: [address] [place] [address] [phone]         Verta Ellen., NP Follow up on 03/06/2021.   Specialty: Cardiology Why: Please arrive 15 minutes early for your 10:30am post-hospital cardiology appointment Contact information: [place]  [address] [phone]                  Discharge Medications   Allergies as of 02/16/2021       Reactions   Asa [aspirin]    Told as child she was allergic        Medication List     STOP taking these  medications    ibuprofen 800 MG tablet Commonly known as: ADVIL   ondansetron 4 MG disintegrating tablet Commonly known as: Zofran ODT   triamcinolone 0.025 % ointment Commonly known as: KENALOG       TAKE these medications    albuterol 108 (90 Base) MCG/ACT inhaler Commonly known as: VENTOLIN HFA Inhale 1-2  puffs into the lungs every 6 (six) hours as needed for wheezing or shortness of breath.   atorvastatin 40 MG tablet Commonly known as: LIPITOR Take 1 tablet (40 mg total) by mouth daily at 6 PM.   clopidogrel 75 MG tablet Commonly known as: PLAVIX Take 1 tablet (75 mg total) by mouth daily. Start taking on: February 17, 2021   empagliflozin 10 MG Tabs tablet Commonly known as: Jardiance Take 1 tablet (10 mg total) by mouth daily before breakfast.   HYDROcodone-acetaminophen 5-325 MG tablet Commonly known as: NORCO/VICODIN Take one tab po q 4 hrs prn pain   lisinopril 10 MG tablet Commonly known as: ZESTRIL Take 10 mg by mouth daily.   metFORMIN 500 MG tablet Commonly known as: GLUCOPHAGE Take 500 mg by mouth 2 (two) times daily with a meal.   metoprolol tartrate 25 MG tablet Commonly known as: LOPRESSOR Take 0.5 tablets (12.5 mg total) by mouth 2 (two) times daily.   nitroGLYCERIN 0.4 MG SL tablet Commonly known as: NITROSTAT Place 1 tablet (0.4 mg total) under the tongue every 5 (five) minutes x 3 doses as needed for chest pain.           Outstanding Labs/Studies   None  Duration of Discharge Encounter   Greater than 30 minutes including physician time.  Signed, Abigail Butts, PA-C 02/16/2021, 3:12 PM

## 2021-02-16 NOTE — Progress Notes (Signed)
Echocardiogram 2D Echocardiogram has been performed.  Oneal Deputy Nuala Chiles RDCS 02/16/2021, 3:25 PM

## 2021-02-17 LAB — HIV ANTIBODY (ROUTINE TESTING W REFLEX): HIV Screen 4th Generation wRfx: NONREACTIVE

## 2021-02-19 ENCOUNTER — Encounter (HOSPITAL_COMMUNITY): Payer: Self-pay | Admitting: Cardiovascular Disease

## 2021-02-20 MED FILL — Heparin Sod (Porcine)-NaCl IV Soln 1000 Unit/500ML-0.9%: INTRAVENOUS | Qty: 1000 | Status: AC

## 2021-02-26 ENCOUNTER — Telehealth: Payer: Self-pay | Admitting: Pharmacist

## 2021-02-26 NOTE — Telephone Encounter (Signed)
Transitions of Care Pharmacy  ° °Call attempted for a pharmacy transitions of care follow-up. HIPAA appropriate voicemail was left with call back information provided.  ° °Call attempt #1. Will follow-up in 2-3 days.  °  °

## 2021-03-01 ENCOUNTER — Other Ambulatory Visit (HOSPITAL_COMMUNITY): Payer: Self-pay

## 2021-03-02 ENCOUNTER — Telehealth (HOSPITAL_COMMUNITY): Payer: Self-pay | Admitting: Pharmacist

## 2021-03-02 ENCOUNTER — Other Ambulatory Visit (HOSPITAL_COMMUNITY): Payer: Self-pay

## 2021-03-02 NOTE — Telephone Encounter (Signed)
Transitions of Care Pharmacy   Call attempted for a pharmacy transitions of care follow-up. HIPAA appropriate voicemail was left with call back information provided.   Call attempt #2. Will follow-up in 2-3 days.    

## 2021-03-05 ENCOUNTER — Telehealth (HOSPITAL_COMMUNITY): Payer: Self-pay

## 2021-03-05 NOTE — Progress Notes (Deleted)
Cardiology Office Note  Date: 03/05/2021   ID: Laura Frost, DOB [birth year], MRN [id number]  PCP:  Donnie Coffin, MD  Cardiologist:  Rozann Lesches, MD Electrophysiologist:  None   Chief Complaint: Hospital follow-up NSTEMI  History of Present Illness: Laura Frost is a 55 y.o. female with a history of CAD, NSTEMI, HTN, DM2, tobacco abuse.  She presented to [facility] on 02/15/2021 for evaluation of chest pain.  She had a sensation of dull pressure-like feeling in her chest with radiation and numbness to both arms.  Described as moderate to severe in intensity .  Waxing and waning for least 30 minutes.  Her initial troponin was 7 and later 304.  She had some anterior EKG changes.  She was taken to the Cath Lab for ischemic evaluation.  Cath showed 80% RPA V and first RPL stenosis.  Otherwise patent coronary arteries.  She was started on Plavix 75 mg daily, atorvastatin 40 mg daily, metoprolol 12.5 mg p.o. twice daily.  Blood pressure was stable.  She was started on metoprolol.  Hemoglobin A1c was 9.1.  Metformin was held.  She was started on Jardiance 10 mg daily.  She was to restart metformin on October 9.  LDL was 69.  Started on atorvastatin 40 mg daily. Smoking cessation was encouraged.  Needs FLP and LFTs in 6 to 8 weeks    Past Medical History:  Diagnosis Date   Bronchitis    Cervical cancer ([place])    Hypertension    Type 2 diabetes mellitus ([place])     Past Surgical History:  Procedure Laterality Date   LEFT HEART CATH AND CORONARY ANGIOGRAPHY N/A 02/15/2021   Procedure: LEFT HEART CATH AND CORONARY ANGIOGRAPHY;  Surgeon: Lorretta Harp, MD;  Location: [place] CV LAB;  Service: Cardiovascular;  Laterality: N/A;   OVARY SURGERY      Current Outpatient Medications  Medication Sig Dispense Refill   albuterol (PROVENTIL HFA;VENTOLIN HFA) 108 (90 Base) MCG/ACT inhaler Inhale 1-2 puffs into the lungs every 6 (six) hours as needed for wheezing or shortness of  breath.     atorvastatin (LIPITOR) 40 MG tablet Take 1 tablet (40 mg total) by mouth daily at 6 PM. 90 tablet 3   clopidogrel (PLAVIX) 75 MG tablet Take 1 tablet (75 mg total) by mouth daily. 90 tablet 3   empagliflozin (JARDIANCE) 10 MG TABS tablet Take 1 tablet (10 mg total) by mouth daily before breakfast. 30 tablet 6   HYDROcodone-acetaminophen (NORCO/VICODIN) 5-325 MG tablet Take one tab po q 4 hrs prn pain (Patient not taking: No sig reported) 10 tablet 0   lisinopril (PRINIVIL,ZESTRIL) 10 MG tablet Take 10 mg by mouth daily.     metFORMIN (GLUCOPHAGE) 500 MG tablet Take 500 mg by mouth 2 (two) times daily with a meal.     metoprolol tartrate (LOPRESSOR) 25 MG tablet Take 0.5 tablets (12.5 mg total) by mouth 2 (two) times daily. 60 tablet 3   nitroGLYCERIN (NITROSTAT) 0.4 MG SL tablet Place 1 tablet (0.4 mg total) under the tongue every 5 (five) minutes x 3 doses as needed for chest pain. 25 tablet 3   No current facility-administered medications for this visit.   Allergies:  Asa [aspirin]   Social History: The patient  reports that she has been smoking cigarettes. She has been smoking an average of .5 packs per day. She has never used smokeless tobacco. She reports current alcohol use. She reports that she does not use  drugs.   Family History: The patient's family history includes Heart attack in her father.   ROS:  Please see the history of present illness. Otherwise, complete review of systems is positive for none.  All other systems are reviewed and negative.   Physical Exam: VS:  There were no vitals taken for this visit., BMI There is no height or weight on file to calculate BMI.  Wt Readings from Last 3 Encounters:  02/16/21 175 lb 7.8 oz (79.6 kg)  02/22/19 180 lb (81.6 kg)  04/02/17 189 lb (85.7 kg)    General: Patient appears comfortable at rest. HEENT: Conjunctiva and lids normal, oropharynx clear with moist mucosa. Neck: Supple, no elevated JVP or carotid bruits, no  thyromegaly. Lungs: Clear to auscultation, nonlabored breathing at rest. Cardiac: Regular rate and rhythm, no S3 or significant systolic murmur, no pericardial rub. Abdomen: Soft, nontender, no hepatomegaly, bowel sounds present, no guarding or rebound. Extremities: No pitting edema, distal pulses 2+. Skin: Warm and dry. Musculoskeletal: No kyphosis. Neuropsychiatric: Alert and oriented x3, affect grossly appropriate.  ECG:  {EKG/Telemetry Strips Reviewed:[phone]}  Recent Labwork: 02/15/2021: ALT 35; AST 24; B Natriuretic Peptide 48.0; Magnesium 1.7 02/16/2021: BUN 6; Creatinine, Ser 0.61; Hemoglobin 15.3; Platelets 158; Potassium 3.8; Sodium 136     Component Value Date/Time   CHOL 148 02/16/2021 0140   TRIG 216 (H) 02/16/2021 0140   HDL 36 (L) 02/16/2021 0140   CHOLHDL 4.1 02/16/2021 0140   VLDL 43 (H) 02/16/2021 0140   LDLCALC 69 02/16/2021 0140    Other Studies Reviewed Today:   LHC 02/15/21:   RPAV lesion is 80% stenosed.   1st RPL lesion is 80% stenosed.   The left ventricular systolic function is normal.   The left ventricular ejection fraction is 55-65% by visual estimate.   IMPRESSION: Mr. Repetto has distal posterolateral branch disease but otherwise widely patent epicardial arteries and normal LV function.  She is aspirin allergic and may benefit from being on a P2 Y 12 inhibitor such as Plavix.  She will need cardiac risk factor modification including smoking cessation and treatment of diabetes and hyperlipidemia.  The hip will be discontinued.  The sheath was removed and a TR band was placed on the right wrist to achieve patent hemostasis.  The patient left the lab in stable condition.   Diagnostic Dominance: Right Echocardigram 02/16/21: 1. Left ventricular ejection fraction, by estimation, is 60 to 65%. The  left ventricle has normal function. The left ventricle has no regional  wall motion abnormalities. There is mild left ventricular hypertrophy.  Left  ventricular diastolic parameters  are consistent with Grade I diastolic dysfunction (impaired relaxation).  Elevated left atrial pressure.   2. Right ventricular systolic function is normal. The right ventricular  size is normal.   3. The mitral valve is normal in structure. Trivial mitral valve  regurgitation. No evidence of mitral stenosis.   4. The aortic valve is normal in structure. Aortic valve regurgitation is  trivial. No aortic stenosis is present.   5. The inferior vena cava is normal in size with greater than 50%  respiratory variability, suggesting right atrial pressure of 3 mmHg.  _____________  Assessment and Plan:  1. NSTEMI (non-ST elevated myocardial infarction) ([place])   2. Essential hypertension   3. Mixed hyperlipidemia   4. Tobacco abuse   5. Type 2 diabetes mellitus without complication, without long-term current use of insulin ([place])    1. NSTEMI (non-ST elevated myocardial infarction) ([facility]) ***  2. Essential hypertension ***  3. Mixed hyperlipidemia ***  4. Tobacco abuse ***  5. Type 2 diabetes mellitus without complication, without long-term current use of insulin ([facility]) ***    Medication Adjustments/Labs and Tests Ordered: Current medicines are reviewed at length with the patient today.  Concerns regarding medicines are outlined above.   Disposition: Follow-up with ***  Signed, Levell July, NP 03/05/2021 4:41 PM    [facility] at [facility] [place], [place], [place] [address] Phone: [phone]; Fax: [phone]

## 2021-03-05 NOTE — Telephone Encounter (Signed)
Transitions of Care Pharmacy   Call attempted for a pharmacy transitions of care follow-up. HIPAA appropriate voicemail was left with call back information provided.   Call attempt #3. Will no longer attempt follow up for TOC pharmacy.   

## 2021-03-06 ENCOUNTER — Ambulatory Visit: Payer: Self-pay | Admitting: Family Medicine

## 2021-03-12 NOTE — Progress Notes (Deleted)
Cardiology Office Note  Date: 03/12/2021   ID: Laura Frost, DOB [birth year], MRN [id number]  PCP:  Laura Coffin, MD  Cardiologist:  Laura Lesches, MD Electrophysiologist:  None   Chief Complaint: Hospital follow-up NSTEMI  History of Present Illness: Laura Frost is a 55 y.o. female with a history of CAD, NSTEMI, HTN, DM2, tobacco abuse.  She presented to [facility] on 02/15/2021 for evaluation of chest pain.  She had a sensation of dull pressure-like feeling in her chest with radiation and numbness to both arms.  Described as moderate to severe in intensity .  Waxing and waning for least 30 minutes.  Her initial troponin was 7 and later 304.  She had some anterior EKG changes.  She was taken to the Cath Lab for ischemic evaluation.  Cath showed 80% RPAV and first RPL stenosis.  Otherwise patent coronary arteries.  She was started on Plavix 75 mg daily, atorvastatin 40 mg daily, metoprolol 12.5 mg p.o. twice daily.  Blood pressure was stable.  She was started on metoprolol.  Hemoglobin A1c was 9.1.  Metformin was held.  She was started on Jardiance 10 mg daily.  She was to restart metformin on October 9.  LDL was 69.  Started on atorvastatin 40 mg daily. Smoking cessation was encouraged.  Needs FLP and LFTs in 6 to 8 weeks    Past Medical History:  Diagnosis Date   Bronchitis    Cervical cancer ([place])    Hypertension    Type 2 diabetes mellitus ([place])     Past Surgical History:  Procedure Laterality Date   LEFT HEART CATH AND CORONARY ANGIOGRAPHY N/A 02/15/2021   Procedure: LEFT HEART CATH AND CORONARY ANGIOGRAPHY;  Surgeon: Laura Harp, MD;  Location: [facility];  Service: Cardiovascular;  Laterality: N/A;   OVARY SURGERY      Current Outpatient Medications  Medication Sig Dispense Refill   albuterol (PROVENTIL HFA;VENTOLIN HFA) 108 (90 Base) MCG/ACT inhaler Inhale 1-2 puffs into the lungs every 6 (six) hours as needed for wheezing or shortness of  breath.     atorvastatin (LIPITOR) 40 MG tablet Take 1 tablet (40 mg total) by mouth daily at 6 PM. 90 tablet 3   clopidogrel (PLAVIX) 75 MG tablet Take 1 tablet (75 mg total) by mouth daily. 90 tablet 3   empagliflozin (JARDIANCE) 10 MG TABS tablet Take 1 tablet (10 mg total) by mouth daily before breakfast. 30 tablet 6   HYDROcodone-acetaminophen (NORCO/VICODIN) 5-325 MG tablet Take one tab po q 4 hrs prn pain (Patient not taking: No sig reported) 10 tablet 0   lisinopril (PRINIVIL,ZESTRIL) 10 MG tablet Take 10 mg by mouth daily.     metFORMIN (GLUCOPHAGE) 500 MG tablet Take 500 mg by mouth 2 (two) times daily with a meal.     metoprolol tartrate (LOPRESSOR) 25 MG tablet Take 0.5 tablets (12.5 mg total) by mouth 2 (two) times daily. 60 tablet 3   nitroGLYCERIN (NITROSTAT) 0.4 MG SL tablet Place 1 tablet (0.4 mg total) under the tongue every 5 (five) minutes x 3 doses as needed for chest pain. 25 tablet 3   No current facility-administered medications for this visit.   Allergies:  Asa [aspirin]   Social History: The patient  reports that she has been smoking cigarettes. She has been smoking an average of .5 packs per day. She has never used smokeless tobacco. She reports current alcohol use. She reports that she does not use drugs.  Family History: The patient's family history includes Heart attack in her father.   ROS:  Please see the history of present illness. Otherwise, complete review of systems is positive for none.  All other systems are reviewed and negative.   Physical Exam: VS:  There were no vitals taken for this visit., BMI There is no height or weight on file to calculate BMI.  Wt Readings from Last 3 Encounters:  02/16/21 175 lb 7.8 oz (79.6 kg)  02/22/19 180 lb (81.6 kg)  04/02/17 189 lb (85.7 kg)    General: Patient appears comfortable at rest. HEENT: Conjunctiva and lids normal, oropharynx clear with moist mucosa. Neck: Supple, no elevated JVP or carotid bruits, no  thyromegaly. Lungs: Clear to auscultation, nonlabored breathing at rest. Cardiac: Regular rate and rhythm, no S3 or significant systolic murmur, no pericardial rub. Abdomen: Soft, nontender, no hepatomegaly, bowel sounds present, no guarding or rebound. Extremities: No pitting edema, distal pulses 2+. Skin: Warm and dry. Musculoskeletal: No kyphosis. Neuropsychiatric: Alert and oriented x3, affect grossly appropriate.  ECG:  {EKG/Telemetry Strips Reviewed:[id number]}  Recent Labwork: 02/15/2021: ALT 35; AST 24; B Natriuretic Peptide 48.0; Magnesium 1.7 02/16/2021: BUN 6; Creatinine, Ser 0.61; Hemoglobin 15.3; Platelets 158; Potassium 3.8; Sodium 136     Component Value Date/Time   CHOL 148 02/16/2021 0140   TRIG 216 (H) 02/16/2021 0140   HDL 36 (L) 02/16/2021 0140   CHOLHDL 4.1 02/16/2021 0140   VLDL 43 (H) 02/16/2021 0140   LDLCALC 69 02/16/2021 0140    Other Studies Reviewed Today:   LHC 02/15/21:   RPAV lesion is 80% stenosed.   1st RPL lesion is 80% stenosed.   The left ventricular systolic function is normal.   The left ventricular ejection fraction is 55-65% by visual estimate.   IMPRESSION: Mr. Zavada has distal posterolateral branch disease but otherwise widely patent epicardial arteries and normal LV function.  She is aspirin allergic and may benefit from being on a P2 Y 12 inhibitor such as Plavix.  She will need cardiac risk factor modification including smoking cessation and treatment of diabetes and hyperlipidemia.  The hip will be discontinued.  The sheath was removed and a TR band was placed on the right wrist to achieve patent hemostasis.  The patient left the lab in stable condition.   Diagnostic Dominance: Right Echocardigram 02/16/21: 1. Left ventricular ejection fraction, by estimation, is 60 to 65%. The  left ventricle has normal function. The left ventricle has no regional  wall motion abnormalities. There is mild left ventricular hypertrophy.  Left  ventricular diastolic parameters  are consistent with Grade I diastolic dysfunction (impaired relaxation).  Elevated left atrial pressure.   2. Right ventricular systolic function is normal. The right ventricular  size is normal.   3. The mitral valve is normal in structure. Trivial mitral valve  regurgitation. No evidence of mitral stenosis.   4. The aortic valve is normal in structure. Aortic valve regurgitation is  trivial. No aortic stenosis is present.   5. The inferior vena cava is normal in size with greater than 50%  respiratory variability, suggesting right atrial pressure of 3 mmHg.  _____________  Assessment and Plan:  1. NSTEMI (non-ST elevated myocardial infarction) ([place])   2. Essential hypertension   3. Mixed hyperlipidemia   4. Tobacco abuse   5. Type 2 diabetes mellitus without complication, without long-term current use of insulin ([place])     1. NSTEMI (non-ST elevated myocardial infarction) [facility]) Recent NSTEMI with cardiac  catheterization demonstrating RPA V lesion 80%, first RPL lesion 8% LVEF normal 55 to 65% by visual estimate.  Plan was to start Plavix due to aspirin allergy.  Continue Plavix 75 mg daily.  Continue sublingual nitroglycerin as needed.  2. Essential hypertension BP today       continue lisinopril 10 mg daily.  Continue Lopressor 12.5 mg p.o. twice daily.  3. Mixed hyperlipidemia Lipid panel on 02/16/2021: TC 148, TG 216, HDL 36, LDL 69.  Continue atorvastatin 40 mg p.o. daily  4. Tobacco abuse Smoking cessation advised  5. Type 2 diabetes mellitus without complication, without long-term current use of insulin (HCC) Continue Jardiance 10 mg daily, continue metformin 500 mg p.o. twice daily   Medication Adjustments/Labs and Tests Ordered: Current medicines are reviewed at length with the patient today.  Concerns regarding medicines are outlined above.   Disposition: Follow-up with ***  Signed, Levell July, NP 03/12/2021 1:11 PM    [facility] at [facility] [place], [place], [place] [address] Phone: [phone]; Fax: [phone]

## 2021-03-13 ENCOUNTER — Ambulatory Visit: Payer: Self-pay | Admitting: Family Medicine

## 2021-03-13 DIAGNOSIS — Z720 Tobacco use: Secondary | ICD-10-CM

## 2021-03-13 DIAGNOSIS — E119 Type 2 diabetes mellitus without complications: Secondary | ICD-10-CM

## 2021-03-13 DIAGNOSIS — I214 Non-ST elevation (NSTEMI) myocardial infarction: Secondary | ICD-10-CM

## 2021-03-13 DIAGNOSIS — I10 Essential (primary) hypertension: Secondary | ICD-10-CM

## 2021-03-13 DIAGNOSIS — E782 Mixed hyperlipidemia: Secondary | ICD-10-CM

## 2021-03-14 NOTE — Progress Notes (Signed)
Cardiology Office Note  Date: 03/15/2021   ID: Laura Frost, DOB 05-Nov-1965, MRN [id number]  PCP:  [facility]  Cardiologist:  Rozann Lesches, MD Electrophysiologist:  None   Chief Complaint: Hospital follow-up NSTEMI  History of Present Illness: Laura Frost is a 55 y.o. female with a history of CAD, NSTEMI, HTN, DM2, tobacco abuse.  She presented to [facility] on 02/15/2021 for evaluation of chest pain.  She had a sensation of dull pressure-like feeling in her chest with radiation and numbness to both arms.  Described as moderate to severe in intensity .  Waxing and waning for least 30 minutes.  Her initial troponin was 7 and later 304.  She had some anterior EKG changes.  She was taken to the Cath Lab for ischemic evaluation.  Cath showed 80% RPAV and first RPL stenosis.  Otherwise patent coronary arteries.  She was started on Plavix 75 mg daily, atorvastatin 40 mg daily, metoprolol 12.5 mg p.o. twice daily.  Blood pressure was stable.  She was started on metoprolol.  Hemoglobin A1c was 9.1.  Metformin was held.  She was started on Jardiance 10 mg daily.  She was to restart metformin on October 9.  LDL was 69.  Started on atorvastatin 40 mg daily. Smoking cessation was encouraged.  She is here today status post NSTEMI.  States she is feeling much better since discharge.  She denies any anginal or exertional symptoms.  Denies any use of nitroglycerin.  She is continuing Plavix instead of aspirin due to allergy to aspirin.  Her blood pressure is elevated today at 164/100.  She states she did not take her antihypertensive this morning prior to coming to clinic.  We discussed taking the medication first thing in the morning.  She does not have a blood pressure cuff to measure her blood pressures at home.  We discussed goal of blood pressure to be 130/80 consistently or below.  She denies any DOE or SOB, PND, orthopnea.  Denies any CVA or TIA-like symptoms,  orthostatic symptoms, palpitations or arrhythmias, bleeding issues, claudication-like symptoms, DVT or PE-like symptoms, or lower extremity edema.  She continues to smoke stating it is hard to quit.  Denies any issues with her right radial access site after cardiac catheterization.   Past Medical History:  Diagnosis Date   Bronchitis    Cervical cancer ([place])    Hypertension    Type 2 diabetes mellitus ([place])     Past Surgical History:  Procedure Laterality Date   LEFT HEART CATH AND CORONARY ANGIOGRAPHY N/A 02/15/2021   Procedure: LEFT HEART CATH AND CORONARY ANGIOGRAPHY;  Surgeon: Lorretta Harp, MD;  Location: [facility];  Service: Cardiovascular;  Laterality: N/A;   OVARY SURGERY      Current Outpatient Medications  Medication Sig Dispense Refill   albuterol (PROVENTIL HFA;VENTOLIN HFA) 108 (90 Base) MCG/ACT inhaler Inhale 1-2 puffs into the lungs every 6 (six) hours as needed for wheezing or shortness of breath.     atorvastatin (LIPITOR) 40 MG tablet Take 1 tablet (40 mg total) by mouth daily at 6 PM. 90 tablet 3   clopidogrel (PLAVIX) 75 MG tablet Take 1 tablet (75 mg total) by mouth daily. 90 tablet 3   empagliflozin (JARDIANCE) 10 MG TABS tablet Take 1 tablet (10 mg total) by mouth daily before breakfast. 30 tablet 6   HYDROcodone-acetaminophen (NORCO/VICODIN) 5-325 MG tablet Take one tab po q 4 hrs prn pain 10 tablet 0  lisinopril (PRINIVIL,ZESTRIL) 10 MG tablet Take 10 mg by mouth daily.     metFORMIN (GLUCOPHAGE) 500 MG tablet Take 500 mg by mouth 2 (two) times daily with a meal.     metoprolol tartrate (LOPRESSOR) 25 MG tablet Take 25 mg by mouth 2 (two) times daily.     nitroGLYCERIN (NITROSTAT) 0.4 MG SL tablet Place 1 tablet (0.4 mg total) under the tongue every 5 (five) minutes x 3 doses as needed for chest pain. 25 tablet 3   No current facility-administered medications for this visit.   Allergies:  Asa [aspirin]   Social History: The patient  reports that  she has been smoking cigarettes. She has been smoking an average of .5 packs per day. She has never used smokeless tobacco. She reports current alcohol use. She reports that she does not use drugs.   Family History: The patient's family history includes Heart attack in her father.   ROS:  Please see the history of present illness. Otherwise, complete review of systems is positive for none.  All other systems are reviewed and negative.   Physical Exam: VS:  BP (!) 164/100   Pulse 64   Ht 5' (1.524 m)   Wt 172 lb 3.2 oz (78.1 kg)   SpO2 98%   BMI 33.63 kg/m , BMI Body mass index is 33.63 kg/m.  Wt Readings from Last 3 Encounters:  03/15/21 172 lb 3.2 oz (78.1 kg)  02/16/21 175 lb 7.8 oz (79.6 kg)  02/22/19 180 lb (81.6 kg)    General: Patient appears comfortable at rest. Neck: Supple, no elevated JVP or carotid bruits, no thyromegaly. Lungs: Clear to auscultation, nonlabored breathing at rest. Cardiac: Regular rate and rhythm, no S3 or significant systolic murmur, no pericardial rub. Extremities: No pitting edema, distal pulses 2+. Skin: Warm and dry.  Right radial access site clean and dry with good pulse. Musculoskeletal: No kyphosis. Neuropsychiatric: Alert and oriented x3, affect grossly appropriate.  ECG:    Recent Labwork: 02/15/2021: ALT 35; AST 24; B Natriuretic Peptide 48.0; Magnesium 1.7 02/16/2021: BUN 6; Creatinine, Ser 0.61; Hemoglobin 15.3; Platelets 158; Potassium 3.8; Sodium 136     Component Value Date/Time   CHOL 148 02/16/2021 0140   TRIG 216 (H) 02/16/2021 0140   HDL 36 (L) 02/16/2021 0140   CHOLHDL 4.1 02/16/2021 0140   VLDL 43 (H) 02/16/2021 0140   LDLCALC 69 02/16/2021 0140    Other Studies Reviewed Today:   LHC 02/15/21:   RPAV lesion is 80% stenosed.   1st RPL lesion is 80% stenosed.   The left ventricular systolic function is normal.   The left ventricular ejection fraction is 55-65% by visual estimate.   IMPRESSION: Laura Frost has distal  posterolateral branch disease but otherwise widely patent epicardial arteries and normal LV function.  She is aspirin allergic and may benefit from being on a P2 Y 12 inhibitor such as Plavix.  She will need cardiac risk factor modification including smoking cessation and treatment of diabetes and hyperlipidemia.  The hip will be discontinued.  The sheath was removed and a TR band was placed on the right wrist to achieve patent hemostasis.  The patient left the lab in stable condition.   Diagnostic Dominance: Right Echocardigram 02/16/21: 1. Left ventricular ejection fraction, by estimation, is 60 to 65%. The  left ventricle has normal function. The left ventricle has no regional  wall motion abnormalities. There is mild left ventricular hypertrophy.  Left ventricular diastolic parameters  are consistent  with Grade I diastolic dysfunction (impaired relaxation).  Elevated left atrial pressure.   2. Right ventricular systolic function is normal. The right ventricular  size is normal.   3. The mitral valve is normal in structure. Trivial mitral valve  regurgitation. No evidence of mitral stenosis.   4. The aortic valve is normal in structure. Aortic valve regurgitation is  trivial. No aortic stenosis is present.   5. The inferior vena cava is normal in size with greater than 50%  respiratory variability, suggesting right atrial pressure of 3 mmHg.  _____________  Assessment and Plan:  1. NSTEMI (non-ST elevated myocardial infarction) ([place])   2. Essential hypertension   3. Mixed hyperlipidemia   4. Tobacco abuse   5. Type 2 diabetes mellitus without complication, without long-term current use of insulin ([place])      1. NSTEMI (non-ST elevated myocardial infarction) ([place]) Recent NSTEMI with cardiac catheterization demonstrating RPA V lesion 80%, first RPL lesion 8% LVEF normal 55 to 65% by visual estimate.  Plan was to start Plavix due to aspirin allergy.  Denies any anginal or exertional  symptoms today.  Denies any nitroglycerin use.  Continue Plavix 75 mg daily.  Continue sublingual nitroglycerin as needed.  Continue metoprolol 25 mg p.o. twice daily  2. Essential hypertension  Blood pressure is elevated today at 164/100.  Recheck in right arm 160/100.  She states she did not take her antihypertensive medications today before coming to clinic.  She states she does not have a blood pressure cuff to measure her blood pressures.  We discussed goal of blood pressure is 130/80 consistently.  Advised to call if blood pressure consistently above 130/80.  Continue lisinopril 10 mg daily.  Continue Lopressor 25 mg p.o. twice daily.  States she needs a refill on her metoprolol.  Please refill metoprolol  3. Mixed hyperlipidemia Lipid panel on 02/16/2021: TC 148, TG 216, HDL 36, LDL 69.  Continue atorvastatin 40 mg p.o. daily.  Please get a follow-up FLP and LFT in 8 weeks.  4. Tobacco abuse Patient still smoking.  Smoking cessation advised  5. Type 2 diabetes mellitus without complication, without long-term current use of insulin (HCC) Continue Jardiance 10 mg daily, continue metformin 500 mg p.o. twice daily.  She states she needs a refill on her Jardiance.  Please refill metoprolol   Medication Adjustments/Labs and Tests Ordered: Current medicines are reviewed at length with the patient today.  Concerns regarding medicines are outlined above.   Disposition: Follow-up with Dr. Domenic Polite or APP 3 months  Signed, Levell July, NP 03/15/2021 8:25 AM    [facility] at [place], [place], [place] [address] Phone: [phone]; Fax: [phone]

## 2021-03-15 ENCOUNTER — Encounter: Payer: Self-pay | Admitting: Family Medicine

## 2021-03-15 ENCOUNTER — Ambulatory Visit (INDEPENDENT_AMBULATORY_CARE_PROVIDER_SITE_OTHER): Payer: Self-pay | Admitting: Family Medicine

## 2021-03-15 VITALS — BP 164/100 | HR 64 | Ht 60.0 in | Wt 172.2 lb

## 2021-03-15 DIAGNOSIS — I214 Non-ST elevation (NSTEMI) myocardial infarction: Secondary | ICD-10-CM

## 2021-03-15 DIAGNOSIS — E782 Mixed hyperlipidemia: Secondary | ICD-10-CM

## 2021-03-15 DIAGNOSIS — Z79899 Other long term (current) drug therapy: Secondary | ICD-10-CM

## 2021-03-15 DIAGNOSIS — I10 Essential (primary) hypertension: Secondary | ICD-10-CM

## 2021-03-15 DIAGNOSIS — Z720 Tobacco use: Secondary | ICD-10-CM

## 2021-03-15 DIAGNOSIS — E119 Type 2 diabetes mellitus without complications: Secondary | ICD-10-CM

## 2021-03-15 MED ORDER — EMPAGLIFLOZIN 10 MG PO TABS: 10.0000 mg | ORAL_TABLET | Freq: Every day | ORAL | 0 refills | Status: DC

## 2021-03-15 NOTE — Patient Instructions (Addendum)
Medication Instructions:  Your physician recommends that you continue on your current medications as directed. Please refer to the Current Medication list given to you today. Please have the Health Department complete patient assistance for Jardiance 10 mg.  Labwork: Your physician recommends that you return for a FASTING lipid/liver profile in 8 weeks around 05/21/2021. Please do not eat or drink for at least 8 hours when you have this done. You may take your medications that morning with a sip of water. Can be done at the Health Department or [facility] or [place]  Testing/Procedures: none  Follow-Up: Your physician recommends that you schedule a follow-up appointment in: 3 months  Any Other Special Instructions Will Be Listed Below (If Applicable). Your physician has requested that you regularly monitor and record your blood pressure readings at home. Please use the same machine at the same time of day to check your readings and record them. Your blood pressure goal is 130/80 or better We will have a blood pressure monitor mailed to your home address  If you need a refill on your cardiac medications before your next appointment, please call your pharmacy.

## 2021-03-22 ENCOUNTER — Telehealth: Payer: Self-pay | Admitting: Family Medicine

## 2021-03-22 NOTE — Telephone Encounter (Signed)
Pt c/o medication issue:  1. Name of Medication:  empagliflozin (JARDIANCE) 10 MG TABS tablet  2. How are you currently taking this medication (dosage and times per day)?  As prescribed  3. Are you having a reaction (difficulty breathing--STAT)?  No   4. What is your medication issue?   Patient states during her appointment on 11/03 with Levell July, NP she was told that her medication would be delivered by [facility] because her local pharmacy does not distribute it. She states the order was placed in the office, but she has not received the medication yet and she would like to know if we have an update. Please advise.

## 2021-03-22 NOTE — Telephone Encounter (Signed)
Advised that she is supposed to coordinate getting Jardiance though the health department Advised that our office did order a BP cuff from [facility] and she would be contacted when it arrives to the office

## 2021-03-28 ENCOUNTER — Telehealth: Payer: Self-pay | Admitting: *Deleted

## 2021-03-28 NOTE — Telephone Encounter (Signed)
Home BP monitor available for pick up

## 2021-03-29 NOTE — Telephone Encounter (Signed)
Patient informed and says she will pick up tomorrow afternoon BP monitor left up front for pick up

## 2021-04-11 NOTE — Telephone Encounter (Signed)
Reminded of BP cuff awaiting pick up Says she will come Friday

## 2021-04-19 ENCOUNTER — Ambulatory Visit: Payer: Self-pay | Admitting: Student

## 2021-06-09 ENCOUNTER — Other Ambulatory Visit: Payer: Self-pay

## 2021-06-09 ENCOUNTER — Emergency Department (HOSPITAL_COMMUNITY)
Admission: EM | Admit: 2021-06-09 | Discharge: 2021-06-09 | Disposition: A | Discharge status: Home / Self Care | Payer: Self-pay | Attending: Emergency Medicine | Admitting: Emergency Medicine

## 2021-06-09 ENCOUNTER — Encounter (HOSPITAL_COMMUNITY): Payer: Self-pay

## 2021-06-09 ENCOUNTER — Emergency Department (HOSPITAL_COMMUNITY): Payer: Self-pay

## 2021-06-09 DIAGNOSIS — W108XXA Fall (on) (from) other stairs and steps, initial encounter: Secondary | ICD-10-CM | POA: Insufficient documentation

## 2021-06-09 DIAGNOSIS — S300XXA Contusion of lower back and pelvis, initial encounter: Secondary | ICD-10-CM

## 2021-06-09 DIAGNOSIS — Z7984 Long term (current) use of oral hypoglycemic drugs: Secondary | ICD-10-CM | POA: Insufficient documentation

## 2021-06-09 DIAGNOSIS — Z7902 Long term (current) use of antithrombotics/antiplatelets: Secondary | ICD-10-CM | POA: Insufficient documentation

## 2021-06-09 DIAGNOSIS — N898 Other specified noninflammatory disorders of vagina: Secondary | ICD-10-CM

## 2021-06-09 DIAGNOSIS — Z79899 Other long term (current) drug therapy: Secondary | ICD-10-CM | POA: Insufficient documentation

## 2021-06-09 LAB — URINALYSIS, ROUTINE W REFLEX MICROSCOPIC
Bilirubin Urine: NEGATIVE
Glucose, UA: NEGATIVE mg/dL
Ketones, ur: NEGATIVE mg/dL
Nitrite: NEGATIVE
Protein, ur: NEGATIVE mg/dL
Specific Gravity, Urine: 1.015 (ref 1.005–1.030)
pH: 6 (ref 5.0–8.0)

## 2021-06-09 LAB — URINALYSIS, MICROSCOPIC (REFLEX)

## 2021-06-09 LAB — POC URINE PREG, ED: Preg Test, Ur: NEGATIVE

## 2021-06-09 MED ORDER — CEPHALEXIN 500 MG PO CAPS: 500.0000 mg | ORAL_CAPSULE | Freq: Two times a day (BID) | ORAL | 0 refills | Status: DC

## 2021-06-09 MED ORDER — HYDROCODONE-ACETAMINOPHEN 5-325 MG PO TABS: 1.0000 | ORAL_TABLET | ORAL | 0 refills | Status: DC | PRN

## 2021-06-09 MED ORDER — DOXYCYCLINE HYCLATE 100 MG PO CAPS: 100.0000 mg | ORAL_CAPSULE | Freq: Two times a day (BID) | ORAL | 0 refills | Status: DC

## 2021-06-09 MED ORDER — CEFTRIAXONE SODIUM 500 MG IJ SOLR
500.0000 mg | Freq: Once | INTRAMUSCULAR | Status: AC
  Administered 2021-06-09: 500 mg via INTRAMUSCULAR
  Filled 2021-06-09: qty 500

## 2021-06-09 MED ORDER — LIDOCAINE HCL (PF) 1 % IJ SOLN
1.0000 mL | Freq: Once | INTRAMUSCULAR | Status: AC
  Administered 2021-06-09: 1 mL
  Filled 2021-06-09: qty 30

## 2021-06-09 NOTE — ED Provider Notes (Signed)
[facility] Provider Note   CSN: [id number] Arrival date & time: 06/09/21  0443     History  Chief Complaint  Patient presents with   Fall    Laura Frost is a 56 y.o. female.  Patient presents to the emergency department for evaluation of right buttock and hip area pain.  Patient reports that she fell 2 days ago.  Patient was coming down some steps that were wet from the rain and slipped, landing on her right buttock area.  Patient reports a large bruise in the area.  She cannot sit on the area because of pain and it hurts more when she tries to walk.  No radiation of pain down the leg.  No numbness, tingling or weakness of extremities.  No change in bowel or bladder function.  She did not hit her head or lose consciousness.  Patient also reports that she "got together" with her ex recently and has had some vaginal discharge since.      Home Medications Prior to Admission medications   Medication Sig Start Date End Date Taking? Authorizing Provider  albuterol (PROVENTIL HFA;VENTOLIN HFA) 108 (90 Base) MCG/ACT inhaler Inhale 1-2 puffs into the lungs every 6 (six) hours as needed for wheezing or shortness of breath.    [provider]  atorvastatin (LIPITOR) 40 MG tablet Take 1 tablet (40 mg total) by mouth daily at 6 PM. 02/16/21   Kroeger, Lorelee Cover., PA-C  clopidogrel (PLAVIX) 75 MG tablet Take 1 tablet (75 mg total) by mouth daily. 02/17/21   Kroeger, Lorelee Cover., PA-C  empagliflozin (JARDIANCE) 10 MG TABS tablet Take 1 tablet (10 mg total) by mouth daily before breakfast. 03/15/21   Verta Ellen., NP  HYDROcodone-acetaminophen (NORCO/VICODIN) 5-325 MG tablet Take one tab po q 4 hrs prn pain 02/22/19   Triplett, Tammy, PA-C  lisinopril (PRINIVIL,ZESTRIL) 10 MG tablet Take 10 mg by mouth daily.    [provider]  metFORMIN (GLUCOPHAGE) 500 MG tablet Take 500 mg by mouth 2 (two) times daily with a meal.    [provider]   metoprolol tartrate (LOPRESSOR) 25 MG tablet Take 25 mg by mouth 2 (two) times daily.    [provider]  nitroGLYCERIN (NITROSTAT) 0.4 MG SL tablet Place 1 tablet (0.4 mg total) under the tongue every 5 (five) minutes x 3 doses as needed for chest pain. 02/16/21   Kroeger, Lorelee Cover., PA-C      Allergies    Asa [aspirin]    Review of Systems   Review of Systems  Genitourinary:  Positive for vaginal discharge.  Skin:  Positive for color change.   Physical Exam Updated Vital Signs BP 136/81 (BP Location: Right Arm)    Pulse 81    Temp 97.8 F (36.6 C)    Resp 20    Ht 5' (1.524 m)    Wt 78 kg    SpO2 98%    BMI 33.58 kg/m  Physical Exam Vitals and nursing note reviewed. Exam conducted with a chaperone present.  Constitutional:      General: She is not in acute distress.    Appearance: She is well-developed.  HENT:     Head: Normocephalic and atraumatic.  Eyes:     Conjunctiva/sclera: Conjunctivae normal.  Cardiovascular:     Rate and Rhythm: Normal rate and regular rhythm.     Pulses:          Dorsalis pedis pulses are 1+ on the right  side and 1+ on the left side.     Heart sounds: No murmur heard. Pulmonary:     Effort: Pulmonary effort is normal. No respiratory distress.     Breath sounds: Normal breath sounds.  Abdominal:     Palpations: Abdomen is soft.     Tenderness: There is no abdominal tenderness.  Musculoskeletal:        General: No swelling.     Cervical back: Neck supple.     Right hip: No deformity or tenderness. Normal range of motion.  Skin:    General: Skin is warm and dry.     Capillary Refill: Capillary refill takes less than 2 seconds.     Findings: Bruising (right buttock) present.  Neurological:     Mental Status: She is alert.     Cranial Nerves: Cranial nerves 2-12 are intact.     Sensory: Sensation is intact.     Motor: Motor function is intact. No weakness.     Comments: Full strength both lower extremities including plantar flexion,  dorsiflexion, flexion extension at the knee, flexion extension at the hip  Psychiatric:        Mood and Affect: Mood normal.    ED Results / Procedures / Treatments   Labs (all labs ordered are listed, but only abnormal results are displayed) Labs Reviewed  URINALYSIS, ROUTINE W REFLEX MICROSCOPIC  GC/CHLAMYDIA PROBE AMP ([facility]) NOT AT [facility]    EKG None  Radiology No results found.  Procedures Procedures    Medications Ordered in ED Medications  cefTRIAXone (ROCEPHIN) injection 500 mg (has no administration in time range)    ED Course/ Medical Decision Making/ A&P                           Medical Decision Making Amount and/or Complexity of Data Reviewed Radiology: ordered.   Patient presents with a large soft tissue bruise of the right gluteal area after a fall 2 days ago.  She landed on steps on her backside.  Lumbar exam is normal, no midline tenderness.  No thoracic tenderness.  She did not hit her head or lose consciousness.  X-ray of right hip and pelvis negative.  Symptoms consistent with soft tissue injury.  Treat with analgesia.  No further work-up necessary.  Patient reports unprotected sex with a prior sexual partner and now is experiencing discharge.  Treat empirically.  Urinalysis and GC chlamydia on urine pending.        Final Clinical Impression(s) / ED Diagnoses Final diagnoses:  Contusion of buttock, initial encounter  Vaginal discharge    Rx / DC Orders ED Discharge Orders     None         Tymeshia Awan, Gwenyth Allegra, MD 06/09/21 [phone]

## 2021-06-09 NOTE — ED Notes (Addendum)
Told pt urine was needed and pt stated "did not need to go at this time". Urine cup is at bedside for when pt feels the urge to go.

## 2021-06-09 NOTE — ED Notes (Signed)
Pt aware Urine sample is requested. Pt unable to provide sample at this time.

## 2021-06-09 NOTE — ED Triage Notes (Signed)
Pt arrived POV c/o injury from a fall at home this past Thursday. Pt reports slipping and falling down a couple steps at home. Pt ambulatory and reports going to work since injury but pain has not gone away. Pt reports taking Percocet at home yesterday with temporary relief, but OTC medications do not help with discomfort. Pt presents with large area of bruising over right posterior hip on buttocks. Pt reports taking a blood thinner at home.

## 2021-06-09 NOTE — ED Notes (Signed)
ED Provider at bedside. 

## 2021-06-11 LAB — GC/CHLAMYDIA PROBE AMP (~~LOC~~) NOT AT ARMC
Chlamydia: NEGATIVE
Comment: NEGATIVE
Comment: NORMAL
Neisseria Gonorrhea: NEGATIVE

## 2021-06-15 ENCOUNTER — Encounter: Payer: Self-pay | Admitting: Cardiology

## 2021-06-15 ENCOUNTER — Ambulatory Visit: Payer: Self-pay | Admitting: Cardiology

## 2021-06-15 NOTE — Progress Notes (Deleted)
Cardiology Office Note  Date: 06/15/2021   ID: Laura Frost, DOB 03-Oct-1965, MRN [id number]  PCP[facility]  Cardiologist:  Rozann Lesches, MD Electrophysiologist:  None   No chief complaint on file.   History of Present Illness: Laura Frost is a 56 y.o. female last seen in November 2022 by [name].  Cardiac catheterization in October 2022 revealed branch vessel disease best managed medically.  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   Cervical cancer ([facility])    Hypertension    Type 2 diabetes mellitus ([facility])     Past Surgical History:  Procedure Laterality Date   LEFT HEART CATH AND CORONARY ANGIOGRAPHY N/A 02/15/2021   Procedure: LEFT HEART CATH AND CORONARY ANGIOGRAPHY;  Surgeon: Lorretta Harp, MD;  Location: [facility];  Service: Cardiovascular;  Laterality: N/A;   OVARY SURGERY      Current Outpatient Medications  Medication Sig Dispense Refill   albuterol (PROVENTIL HFA;VENTOLIN HFA) 108 (90 Base) MCG/ACT inhaler Inhale 1-2 puffs into the lungs every 6 (six) hours as needed for wheezing or shortness of breath.     atorvastatin (LIPITOR) 40 MG tablet Take 1 tablet (40 mg total) by mouth daily at 6 PM. 90 tablet 3   cephALEXin (KEFLEX) 500 MG capsule Take 1 capsule (500 mg total) by mouth 2 (two) times daily. 14 capsule 0   clopidogrel (PLAVIX) 75 MG tablet Take 1 tablet (75 mg total) by mouth daily. 90 tablet 3   doxycycline (VIBRAMYCIN) 100 MG capsule Take 1 capsule (100 mg total) by mouth 2 (two) times daily. 20 capsule 0   empagliflozin (JARDIANCE) 10 MG TABS tablet Take 1 tablet (10 mg total) by mouth daily before breakfast. 28 tablet 0   HYDROcodone-acetaminophen (NORCO/VICODIN) 5-325 MG tablet Take 1 tablet by mouth every 4 (four) hours as needed for moderate pain. 10 tablet 0   lisinopril (PRINIVIL,ZESTRIL) 10 MG tablet Take  10 mg by mouth daily.     metFORMIN (GLUCOPHAGE) 500 MG tablet Take 500 mg by mouth 2 (two) times daily with a meal.     metoprolol tartrate (LOPRESSOR) 25 MG tablet Take 25 mg by mouth 2 (two) times daily.     nitroGLYCERIN (NITROSTAT) 0.4 MG SL tablet Place 1 tablet (0.4 mg total) under the tongue every 5 (five) minutes x 3 doses as needed for chest pain. 25 tablet 3   No current facility-administered medications for this visit.   Allergies:  Asa [aspirin]   Social History: The patient  reports that she has been smoking cigarettes. She has been smoking an average of .5 packs per day. She has never used smokeless tobacco. She reports current alcohol use. She reports that she does not use drugs.   Family History: The patient's family history includes Heart attack in her father.   ROS:  Please see the history of present illness. Otherwise, complete review of systems is positive for {NONE DEFAULTED:18576}.  All other systems are reviewed and negative.   Physical Exam: VS:  There were no vitals taken for this visit., BMI There is no height or weight on file to calculate BMI.  Wt Readings from Last 3 Encounters:  06/09/21 171 lb 15.3 oz (78 kg)  03/15/21 172 lb 3.2 oz (78.1 kg)  02/16/21 175 lb 7.8 oz (79.6 kg)    General: Patient appears comfortable at  rest. HEENT: Conjunctiva and lids normal, oropharynx clear with moist mucosa. Neck: Supple, no elevated JVP or carotid bruits, no thyromegaly. Lungs: Clear to auscultation, nonlabored breathing at rest. Cardiac: Regular rate and rhythm, no S3 or significant systolic murmur, no pericardial rub. Abdomen: Soft, nontender, no hepatomegaly, bowel sounds present, no guarding or rebound. Extremities: No pitting edema, distal pulses 2+. Skin: Warm and dry. Musculoskeletal: No kyphosis. Neuropsychiatric: Alert and oriented x3, affect grossly appropriate.  ECG:  An ECG dated 02/15/2021 was personally reviewed today and demonstrated:  Sinus rhythm  with poor R wave progression and increased voltage.  Recent Labwork: 02/15/2021: ALT 35; AST 24; B Natriuretic Peptide 48.0; Magnesium 1.7 02/16/2021: BUN 6; Creatinine, Ser 0.61; Hemoglobin 15.3; Platelets 158; Potassium 3.8; Sodium 136     Component Value Date/Time   CHOL 148 02/16/2021 0140   TRIG 216 (H) 02/16/2021 0140   HDL 36 (L) 02/16/2021 0140   CHOLHDL 4.1 02/16/2021 0140   VLDL 43 (H) 02/16/2021 0140   LDLCALC 69 02/16/2021 0140    Other Studies Reviewed Today:  Cardiac catheterization 02/15/2021:   RPAV lesion is 80% stenosed.   1st RPL lesion is 80% stenosed.   The left ventricular systolic function is normal.   The left ventricular ejection fraction is 55-65% by visual estimate.  IMPRESSION: [name] has distal posterolateral branch disease but otherwise widely patent epicardial arteries and normal LV function.  She is aspirin allergic and may benefit from being on a P2 Y 12 inhibitor such as Plavix.  She will need cardiac risk factor modification including smoking cessation and treatment of diabetes and hyperlipidemia.    Echocardiogram 02/16/2021:  1. Left ventricular ejection fraction, by estimation, is 60 to 65%. The  left ventricle has normal function. The left ventricle has no regional  wall motion abnormalities. There is mild left ventricular hypertrophy.  Left ventricular diastolic parameters  are consistent with Grade I diastolic dysfunction (impaired relaxation).  Elevated left atrial pressure.   2. Right ventricular systolic function is normal. The right ventricular  size is normal.   3. The mitral valve is normal in structure. Trivial mitral valve  regurgitation. No evidence of mitral stenosis.   4. The aortic valve is normal in structure. Aortic valve regurgitation is  trivial. No aortic stenosis is present.   5. The inferior vena cava is normal in size with greater than 50%  respiratory variability, suggesting right atrial pressure of 3 mmHg.    Assessment and Plan:    Medication Adjustments/Labs and Tests Ordered: Current medicines are reviewed at length with the patient today.  Concerns regarding medicines are outlined above.   Tests Ordered: No orders of the defined types were placed in this encounter.   Medication Changes: No orders of the defined types were placed in this encounter.   Disposition:  Follow up {follow up:15908}  Signed, Satira Sark, MD, [facility] 06/15/2021 8:24 AM    [facility] [place], [place], [place] [address] Phone: [phone]; Fax: [phone]

## 2022-02-19 ENCOUNTER — Telehealth: Payer: Self-pay

## 2022-02-19 ENCOUNTER — Other Ambulatory Visit: Payer: Self-pay

## 2022-02-19 NOTE — Telephone Encounter (Signed)
Telephoned patient at mobile number. Phone number unavailable at this time. [facility] ([facility]olars[facility]) unable to leave a message for patient.

## 2022-03-10 ENCOUNTER — Other Ambulatory Visit: Payer: Self-pay

## 2022-03-10 ENCOUNTER — Observation Stay (HOSPITAL_COMMUNITY)
Admission: EM | Admit: 2022-03-10 | Discharge: 2022-03-11 | Disposition: A | Discharge status: Home / Self Care | Payer: Self-pay | Attending: Family Medicine | Admitting: Family Medicine

## 2022-03-10 ENCOUNTER — Emergency Department (HOSPITAL_COMMUNITY): Payer: Self-pay

## 2022-03-10 ENCOUNTER — Encounter (HOSPITAL_COMMUNITY): Payer: Self-pay

## 2022-03-10 ENCOUNTER — Encounter (HOSPITAL_COMMUNITY): Payer: Self-pay | Admitting: Emergency Medicine

## 2022-03-10 DIAGNOSIS — E876 Hypokalemia: Secondary | ICD-10-CM | POA: Insufficient documentation

## 2022-03-10 DIAGNOSIS — Z720 Tobacco use: Secondary | ICD-10-CM

## 2022-03-10 DIAGNOSIS — I25119 Atherosclerotic heart disease of native coronary artery with unspecified angina pectoris: Secondary | ICD-10-CM

## 2022-03-10 DIAGNOSIS — Z7902 Long term (current) use of antithrombotics/antiplatelets: Secondary | ICD-10-CM | POA: Insufficient documentation

## 2022-03-10 DIAGNOSIS — Z8541 Personal history of malignant neoplasm of cervix uteri: Secondary | ICD-10-CM | POA: Insufficient documentation

## 2022-03-10 DIAGNOSIS — R079 Chest pain, unspecified: Secondary | ICD-10-CM | POA: Diagnosis present

## 2022-03-10 DIAGNOSIS — I2510 Atherosclerotic heart disease of native coronary artery without angina pectoris: Secondary | ICD-10-CM | POA: Insufficient documentation

## 2022-03-10 DIAGNOSIS — Z79899 Other long term (current) drug therapy: Secondary | ICD-10-CM | POA: Insufficient documentation

## 2022-03-10 DIAGNOSIS — F17210 Nicotine dependence, cigarettes, uncomplicated: Secondary | ICD-10-CM | POA: Insufficient documentation

## 2022-03-10 DIAGNOSIS — I10 Essential (primary) hypertension: Secondary | ICD-10-CM

## 2022-03-10 DIAGNOSIS — Z23 Encounter for immunization: Secondary | ICD-10-CM | POA: Insufficient documentation

## 2022-03-10 DIAGNOSIS — R0789 Other chest pain: Principal | ICD-10-CM | POA: Insufficient documentation

## 2022-03-10 DIAGNOSIS — Z7984 Long term (current) use of oral hypoglycemic drugs: Secondary | ICD-10-CM | POA: Insufficient documentation

## 2022-03-10 DIAGNOSIS — E119 Type 2 diabetes mellitus without complications: Secondary | ICD-10-CM | POA: Insufficient documentation

## 2022-03-10 LAB — CBC
HCT: 44.5 % (ref 36.0–46.0)
HCT: 39.4 % (ref 36.0–46.0)
Hemoglobin: 15.2 g/dL — ABNORMAL HIGH (ref 12.0–15.0)
Hemoglobin: 13.5 g/dL (ref 12.0–15.0)
MCH: 33.7 pg (ref 26.0–34.0)
MCH: 33.6 pg (ref 26.0–34.0)
MCHC: 34.2 g/dL (ref 30.0–36.0)
MCHC: 34.3 g/dL (ref 30.0–36.0)
MCV: 98.7 fL (ref 80.0–100.0)
MCV: 98 fL (ref 80.0–100.0)
Platelets: 195 10*3/uL (ref 150–400)
Platelets: 189 10*3/uL (ref 150–400)
RBC: 4.51 MIL/uL (ref 3.87–5.11)
RBC: 4.02 MIL/uL (ref 3.87–5.11)
RDW: 12.9 % (ref 11.5–15.5)
RDW: 12.9 % (ref 11.5–15.5)
WBC: 7.7 10*3/uL (ref 4.0–10.5)
WBC: 7 10*3/uL (ref 4.0–10.5)
nRBC: 0 % (ref 0.0–0.2)
nRBC: 0 % (ref 0.0–0.2)

## 2022-03-10 LAB — WET PREP, GENITAL
Sperm: NONE SEEN
Trich, Wet Prep: NONE SEEN
WBC, Wet Prep HPF POC: >=10 — AB (ref <10)
Yeast Wet Prep HPF POC: NONE SEEN

## 2022-03-10 LAB — TROPONIN I (HIGH SENSITIVITY)
Troponin I (High Sensitivity): 6 ng/L (ref <18)
Troponin I (High Sensitivity): 6 ng/L (ref <18)
Troponin I (High Sensitivity): 6 ng/L (ref <18)
Troponin I (High Sensitivity): 5 ng/L (ref <18)

## 2022-03-10 LAB — BASIC METABOLIC PANEL
Anion gap: 11 (ref 5–15)
BUN: 12 mg/dL (ref 6–20)
CO2: 25 mmol/L (ref 22–32)
Calcium: 9.1 mg/dL (ref 8.9–10.3)
Chloride: 102 mmol/L (ref 98–111)
Creatinine, Ser: 0.66 mg/dL (ref 0.44–1.00)
GFR, Estimated: >60 mL/min (ref >60)
Glucose, Bld: 243 mg/dL — ABNORMAL HIGH (ref 70–99)
Potassium: 3.1 mmol/L — ABNORMAL LOW (ref 3.5–5.1)
Sodium: 138 mmol/L (ref 135–145)

## 2022-03-10 LAB — MAGNESIUM: Magnesium: 1.6 mg/dL — ABNORMAL LOW (ref 1.7–2.4)

## 2022-03-10 LAB — CREATININE, SERUM
Creatinine, Ser: 0.53 mg/dL (ref 0.44–1.00)
GFR, Estimated: >60 mL/min (ref >60)

## 2022-03-10 LAB — HIV ANTIBODY (ROUTINE TESTING W REFLEX): HIV Screen 4th Generation wRfx: NONREACTIVE

## 2022-03-10 MED ORDER — ACETAMINOPHEN 325 MG PO TABS: 650.0000 mg | ORAL_TABLET | ORAL | Status: DC | PRN

## 2022-03-10 MED ORDER — LISINOPRIL 10 MG PO TABS
10.0000 mg | ORAL_TABLET | Freq: Every day | ORAL | Status: DC
  Administered 2022-03-10 – 2022-03-11 (×2): 10 mg via ORAL
  Filled 2022-03-10 (×2): qty 1

## 2022-03-10 MED ORDER — ALUM & MAG HYDROXIDE-SIMETH 200-200-20 MG/5ML PO SUSP
30.0000 mL | Freq: Once | ORAL | Status: AC
  Administered 2022-03-10: 30 mL via ORAL
  Filled 2022-03-10: qty 30

## 2022-03-10 MED ORDER — METRONIDAZOLE 500 MG PO TABS
500.0000 mg | ORAL_TABLET | Freq: Two times a day (BID) | ORAL | Status: DC
  Administered 2022-03-10 – 2022-03-11 (×3): 500 mg via ORAL
  Filled 2022-03-10 (×3): qty 1

## 2022-03-10 MED ORDER — MORPHINE SULFATE (PF) 2 MG/ML IV SOLN: 1.0000 mg | INTRAVENOUS | Status: DC | PRN

## 2022-03-10 MED ORDER — ALBUTEROL SULFATE (2.5 MG/3ML) 0.083% IN NEBU: 2.5000 mg | INHALATION_SOLUTION | Freq: Four times a day (QID) | RESPIRATORY_TRACT | Status: DC | PRN

## 2022-03-10 MED ORDER — MAGNESIUM SULFATE 4 GM/100ML IV SOLN
4.0000 g | Freq: Once | INTRAVENOUS | Status: AC
  Administered 2022-03-10: 4 g via INTRAVENOUS
  Filled 2022-03-10: qty 100

## 2022-03-10 MED ORDER — NITROGLYCERIN 0.4 MG SL SUBL
0.4000 mg | SUBLINGUAL_TABLET | SUBLINGUAL | Status: DC | PRN
  Administered 2022-03-10: 0.4 mg via SUBLINGUAL
  Filled 2022-03-10: qty 1

## 2022-03-10 MED ORDER — ENOXAPARIN SODIUM 40 MG/0.4ML IJ SOSY
40.0000 mg | PREFILLED_SYRINGE | INTRAMUSCULAR | Status: DC
  Administered 2022-03-10 – 2022-03-11 (×2): 40 mg via SUBCUTANEOUS
  Filled 2022-03-10 (×2): qty 0.4

## 2022-03-10 MED ORDER — ONDANSETRON HCL 4 MG/2ML IJ SOLN
4.0000 mg | Freq: Once | INTRAMUSCULAR | Status: AC
  Administered 2022-03-10: 4 mg via INTRAVENOUS
  Filled 2022-03-10: qty 2

## 2022-03-10 MED ORDER — FAMOTIDINE 20 MG PO TABS
20.0000 mg | ORAL_TABLET | Freq: Once | ORAL | Status: AC
  Administered 2022-03-10: 20 mg via ORAL
  Filled 2022-03-10: qty 1

## 2022-03-10 MED ORDER — ATORVASTATIN CALCIUM 40 MG PO TABS
40.0000 mg | ORAL_TABLET | Freq: Every day | ORAL | Status: DC
  Administered 2022-03-10: 40 mg via ORAL
  Filled 2022-03-10: qty 1

## 2022-03-10 MED ORDER — MORPHINE SULFATE (PF) 4 MG/ML IV SOLN
4.0000 mg | INTRAVENOUS | Status: DC | PRN
  Administered 2022-03-10: 4 mg via INTRAVENOUS
  Filled 2022-03-10: qty 1

## 2022-03-10 MED ORDER — EMPAGLIFLOZIN 10 MG PO TABS
10.0000 mg | ORAL_TABLET | Freq: Every day | ORAL | Status: DC
  Administered 2022-03-11: 10 mg via ORAL
  Filled 2022-03-10: qty 1

## 2022-03-10 MED ORDER — CLOPIDOGREL BISULFATE 75 MG PO TABS
75.0000 mg | ORAL_TABLET | Freq: Every day | ORAL | Status: DC
  Administered 2022-03-10 – 2022-03-11 (×2): 75 mg via ORAL
  Filled 2022-03-10 (×2): qty 1

## 2022-03-10 MED ORDER — ONDANSETRON HCL 4 MG/2ML IJ SOLN: 4.0000 mg | Freq: Four times a day (QID) | INTRAMUSCULAR | Status: DC | PRN

## 2022-03-10 MED ORDER — METOPROLOL TARTRATE 25 MG PO TABS
25.0000 mg | ORAL_TABLET | Freq: Two times a day (BID) | ORAL | Status: DC
  Administered 2022-03-10 – 2022-03-11 (×2): 25 mg via ORAL
  Filled 2022-03-10 (×3): qty 1

## 2022-03-10 MED ORDER — ALBUTEROL SULFATE HFA 108 (90 BASE) MCG/ACT IN AERS: 1.0000 | INHALATION_SPRAY | Freq: Four times a day (QID) | RESPIRATORY_TRACT | Status: DC | PRN

## 2022-03-10 MED ORDER — NICOTINE 21 MG/24HR TD PT24
21.0000 mg | MEDICATED_PATCH | Freq: Every day | TRANSDERMAL | Status: DC
  Administered 2022-03-10 – 2022-03-11 (×2): 21 mg via TRANSDERMAL
  Filled 2022-03-10 (×2): qty 1

## 2022-03-10 MED ORDER — ZOLPIDEM TARTRATE 5 MG PO TABS: 5.0000 mg | ORAL_TABLET | Freq: Every evening | ORAL | Status: DC | PRN

## 2022-03-10 MED ORDER — PNEUMOCOCCAL 20-VAL CONJ VACC 0.5 ML IM SUSY
0.5000 mL | PREFILLED_SYRINGE | INTRAMUSCULAR | Status: AC
  Administered 2022-03-11: 0.5 mL via INTRAMUSCULAR
  Filled 2022-03-10: qty 0.5

## 2022-03-10 MED ORDER — POTASSIUM CHLORIDE CRYS ER 20 MEQ PO TBCR
40.0000 meq | EXTENDED_RELEASE_TABLET | Freq: Once | ORAL | Status: AC
  Administered 2022-03-10: 40 meq via ORAL
  Filled 2022-03-10: qty 2

## 2022-03-10 MED ORDER — NITROGLYCERIN 0.4 MG SL SUBL: 0.4000 mg | SUBLINGUAL_TABLET | SUBLINGUAL | Status: DC | PRN

## 2022-03-10 MED ORDER — INFLUENZA VAC SPLIT QUAD 0.5 ML IM SUSY
0.5000 mL | PREFILLED_SYRINGE | INTRAMUSCULAR | Status: AC
  Administered 2022-03-11: 0.5 mL via INTRAMUSCULAR
  Filled 2022-03-10: qty 0.5

## 2022-03-10 NOTE — ED Provider Notes (Signed)
Clinical Course as of 03/10/22 1448  Sun Mar 10, 2022  0700 Stable 56 YOF with Chest pain. NSTEMI last year with CAD on cath. No EKG changes or change with nitro today. Initial troponin 6, delta pending. ASA allergic. Reassess and likely admit.  [CC]  0802 2nd trop ok reassess [CC]  0315 I reassessed at bedside.  Patient is continuing to have 7 out of 10 substernal chest pain that she states feels just like her prior MI.  It has been bothering her with only mild improvement from morphine.  Given history of similar comorbid disease, will consult with cardiology and plan for admission given her ongoing symptoms. [CC]  0915 Nishan [CC]    Clinical Course User Index [CC] Tretha Sciara, MD   Cardiology recommended admission for ongoing pain and evaluation by cardiology in the inpatient setting tomorrow.  Patient admitted with no further acute events.   Tretha Sciara, MD 03/10/22 [phone]

## 2022-03-10 NOTE — Assessment & Plan Note (Signed)
-   Nicotine patch ordered and counseled on tobacco cessation

## 2022-03-10 NOTE — Assessment & Plan Note (Signed)
-   Resume home metoprolol and lisinopril

## 2022-03-10 NOTE — Hospital Course (Signed)
56 year old female with coronary artery disease, tobacco abuse, type 2 diabetes mellitus, hypertension, history of NSTEMI, aspirin allergy who presented to the emergency department complaining of chest pain.  She had a similar presentation approximately 1 year ago where she presented to the hospital for similar symptoms of chest pain and subsequently had NSTEMI.  She was transferred to [facility] taking to cath that showed 80% RPAV and first RPL stenosis.  She has been treated medically.  Today patient presented to the ED complaining of severe midsternal chest pain that started about 1 hour prior to arrival.  She describes her symptoms as a dull pressure in the center of the chest that is nonradiating.  She is having some nausea and shortness of breath with symptoms.  She took 2 nitroglycerin tablets at home with no relief in symptoms.  She still smoking.  Her initial troponin testing in the ED has been reassuring.  Her chest x-ray is reassuring.  EKG did not show acute ischemic changes.  Cardiology was consulted and recommended patient be admitted to [facility] and will see patient in a.m.  Continue to cycle troponins and continue current medical management of coronary artery disease was recommended.

## 2022-03-10 NOTE — Assessment & Plan Note (Signed)
-   Currently being medically managed, resume home Plavix, nitroglycerin, lisinopril, atorvastatin, metoprolol, empagliflozin

## 2022-03-10 NOTE — ED Provider Notes (Signed)
[facility] EMERGENCY DEPARTMENT Provider Note   CSN: [id number] Arrival date & time: 03/10/22  0441     History  Chief Complaint  Patient presents with   Chest Pain    Laura Frost is a 56 y.o. female.  The history is provided by the patient.  Chest Pain She has history of hypertension, diabetes, hyperlipidemia, coronary artery disease and comes in complaining of pain in her lower sternal area which started about an hour ago.  Pain is a dull, pressure feeling without radiation.  There is associated dyspnea, nausea.  She denies diaphoresis.  She tried 2 nitroglycerin at home with no relief.  This nitroglycerin did give her headache but did not burn or tingle under her tongue.  She does admit that she still smokes.  There is a family history of premature coronary atherosclerosis.  I have ordered laboratory work-up of CBC, basic metabolic panel, troponin x2.  I have ordered a chest x-ray.   Home Medications Prior to Admission medications   Medication Sig Start Date End Date Taking? Authorizing Provider  albuterol (PROVENTIL HFA;VENTOLIN HFA) 108 (90 Base) MCG/ACT inhaler Inhale 1-2 puffs into the lungs every 6 (six) hours as needed for wheezing or shortness of breath.    [provider]  atorvastatin (LIPITOR) 40 MG tablet Take 1 tablet (40 mg total) by mouth daily at 6 PM. 02/16/21   Kroeger, Lorelee Cover., PA-C  cephALEXin (KEFLEX) 500 MG capsule Take 1 capsule (500 mg total) by mouth 2 (two) times daily. 06/09/21   Orpah Greek, MD  clopidogrel (PLAVIX) 75 MG tablet Take 1 tablet (75 mg total) by mouth daily. 02/17/21   Kroeger, Lorelee Cover., PA-C  doxycycline (VIBRAMYCIN) 100 MG capsule Take 1 capsule (100 mg total) by mouth 2 (two) times daily. 06/09/21   Orpah Greek, MD  empagliflozin (JARDIANCE) 10 MG TABS tablet Take 1 tablet (10 mg total) by mouth daily before breakfast. 03/15/21   Verta Ellen., NP  HYDROcodone-acetaminophen (NORCO/VICODIN) 5-325 MG  tablet Take 1 tablet by mouth every 4 (four) hours as needed for moderate pain. 06/09/21   Orpah Greek, MD  lisinopril (PRINIVIL,ZESTRIL) 10 MG tablet Take 10 mg by mouth daily.    [provider]  metFORMIN (GLUCOPHAGE) 500 MG tablet Take 500 mg by mouth 2 (two) times daily with a meal.    [provider]  metoprolol tartrate (LOPRESSOR) 25 MG tablet Take 25 mg by mouth 2 (two) times daily.    [provider]  nitroGLYCERIN (NITROSTAT) 0.4 MG SL tablet Place 1 tablet (0.4 mg total) under the tongue every 5 (five) minutes x 3 doses as needed for chest pain. 02/16/21   Kroeger, Lorelee Cover., PA-C      Allergies    Asa [aspirin]    Review of Systems   Review of Systems  Cardiovascular:  Positive for chest pain.  All other systems reviewed and are negative.   Physical Exam Updated Vital Signs BP (!) 178/94 (BP Location: Left Arm)   Pulse 70   Temp 98.1 F (36.7 C) (Oral)   Resp 18   Ht 5' (1.524 m)   Wt 78 kg   SpO2 100%   BMI 33.58 kg/m  Physical Exam Vitals and nursing note reviewed.   56 year old female, appears very uncomfortable, but is in no acute distress. Vital signs are significant for elevated blood pressure. Oxygen saturation is 100%, which is normal. Head is normocephalic and atraumatic. PERRLA, EOMI. Oropharynx  is clear. Neck is nontender and supple without adenopathy or JVD. Back is nontender and there is no CVA tenderness. Lungs are clear without rales, wheezes, or rhonchi. Chest is nontender. Heart has regular rate and rhythm without murmur. Abdomen is soft, flat, nontender. Extremities have no cyanosis or edema, full range of motion is present. Skin is warm and dry without rash. Neurologic: Mental status is normal, cranial nerves are intact, moves all extremities equally.  ED Results / Procedures / Treatments   Labs (all labs ordered are listed, but only abnormal results are displayed) Labs Reviewed  BASIC METABOLIC PANEL -  Abnormal; Notable for the following components:      Result Value   Potassium 3.1 (*)    Glucose, Bld 243 (*)    All other components within normal limits  CBC - Abnormal; Notable for the following components:   Hemoglobin 15.2 (*)    All other components within normal limits  WET PREP, GENITAL  MAGNESIUM  POC URINE PREG, ED  TROPONIN I (HIGH SENSITIVITY)  TROPONIN I (HIGH SENSITIVITY)    EKG EKG Interpretation  Date/Time:  Sunday March 10 2022 04:48:51 EDT Ventricular Rate:  72 PR Interval:  160 QRS Duration: 87 QT Interval:  393 QTC Calculation: 431 R Axis:   -35 Text Interpretation: Sinus rhythm Left axis deviation Abnormal R-wave progression, late transition When compared with ECG of 02/15/2021, No significant change was found Confirmed by Delora Fuel ([id number]) on 03/10/2022 4:53:04 AM  Radiology No results found.  Procedures Procedures  Cardiac monitor shows normal sinus rhythm, per my interpretation.  Medications Ordered in ED Medications - No data to display  ED Course/ Medical Decision Making/ A&P Clinical Course as of 03/10/22 0729  Sun Mar 10, 2022  0700 Stable 56 YOF with Chest pain. NSTEMI last year with CAD on cath. No EKG changes or change with nitro today. Initial troponin 6, delta pending. ASA allergic. Reassess and likely admit.  [CC]    Clinical Course User Index [CC] Tretha Sciara, MD                           Medical Decision Making Amount and/or Complexity of Data Reviewed Labs: ordered. Radiology: ordered.  Risk Prescription drug management.   Chest pain very concerning for ACS.  Differential diagnosis also includes GERD, pneumonia, pulmonary embolism, aortic dissection.  I have reviewed and interpreted her electrocardiogram, and my interpretation is left axis deviation, poor R wave progression, unchanged from prior.  Unfortunately, she is aspirin allergic, so cannot get aspirin.  I have ordered ondansetron for nausea, nitroglycerin for  chest discomfort.  I have reviewed her past records, and on 02/15/2021 she had a cardiac catheterization which showed 80% blockages in 2 branches off of the right coronary artery.  These were not amenable to PCI or bypass.  Therefore, she does have myocardium at risk.  On 02/16/2021 she had an echocardiogram showing ejection fraction 32-67%, grade 1 diastolic dysfunction.  She had no relief of pain with nitroglycerin.  I have ordered a dose of morphine with good relief of pain.  She did have good relief of nausea with ondansetron.  Initial troponin is normal, repeat troponin is pending.  She informed her nurse that she wanted to be tested for trichomonas, I have ordered a wet prep genital with patient to self swab.  I have reviewed and interpreted her laboratory tests, and my interpretation is hypokalemia, normal initial troponin, normal CBC.  I have ordered a dose of oral potassium.  Case is signed out to Dr. Oswald Hillock.  CRITICAL CARE Performed by: Delora Fuel Total critical care time: 40 minutes Critical care time was exclusive of separately billable procedures and treating other patients. Critical care was necessary to treat or prevent imminent or life-threatening deterioration. Critical care was time spent personally by me on the following activities: development of treatment plan with patient and/or surrogate as well as nursing, discussions with consultants, evaluation of patient's response to treatment, examination of patient, obtaining history from patient or surrogate, ordering and performing treatments and interventions, ordering and review of laboratory studies, ordering and review of radiographic studies, pulse oximetry and re-evaluation of patient's condition.  Final Clinical Impression(s) / ED Diagnoses Final diagnoses:  None    Rx / DC Orders ED Discharge Orders     None         Delora Fuel, MD 65/68/12 [phone]

## 2022-03-10 NOTE — Discharge Instructions (Addendum)
[place]: [place], [place]  Provides hot meals seven days a week and twice (lunch and afternoon) on Saturdays and  Sundays. [employer] serves lunch Monday - Friday between 11 a.m. and noon. [place] [phone] Come Tuesdays and Wednesdays to sign up from 2-4 pm with I.D. and proof of address.  Distribute food every other Thursday from 9-12.  [facility] - [place] 11:30-1:00 Blessing Pantry across from [place], behind the free clinic in [place].  It is a self-serve and the community can drop things in it. "Give if you can ... take if you  need". Anyone can give and anyone can go by. P a g e  [place] - Updated April 2020  [place], [place]. You have to call the number and leave a message and someone will call  you back to set up a time to come and pick up. [employer] - https://bit.ly/3cO1Az1 Bagged lunches/breakfast - Parents can request service online and then pick up their  meals Monday-Friday from 11:30-12:00. [facility] delivers the meals each day. They sort,  combine and place meals for each family on tables for pickup so that they adhere to social  distancing.  [facility] in [place], [place]: [address] [place] [phone] Distribution is from 10 a.m. to 1 p.m. the 2nd and 4th Tuesdays. Clients must sign up before  the day of distribution. [employer], [place]: [place] Pantry hours: 9 a.m.-11:45 a.m. Mondays, Wednesdays, Thursdays, and Fridays. The  pantry is closed on Tuesdays. [facility] [place] - [phone]  Services Offered: Meals on [place] care, at home assisted living,  [place] for [employer],  transportation [place] [facility] [place] 4th Wednesday of each month 9:30 a.m. to 12 noon [place], [place] [phone] Services Offered: Application and I.D are required. You must have a copy of a current bill.  Call on Tuesdays for appointment and complete application between 9:56-21:30QM.  They will purchase fresh food and you are able to pick the food up in the same afternoon. [facility] (formerly [employer]) - [place] [address]. [phone] Services Offered: Food assistance on Tuesdays from 2-4 pm with photo I.D. and  proof of address  Sheets - lunch for kids (limited numbers daily)  [employer] - [address], [place] Free kids-meal of hot dog (or grilled cheese), chips and small drink for any student 12 and  under while school is out. Eat in only and limit one per day. P a g e  [place] - Updated April 2020  Story hot dog, chips, water or juice for kids.    IMPORTANT INFORMATION: PAY CLOSE ATTENTION   PHYSICIAN DISCHARGE INSTRUCTIONS  Follow with Primary care provider  [facility], [facility]  and other consultants as instructed by your [place] IF SYMPTOMS COME BACK, WORSEN OR NEW PROBLEM DEVELOPS   Please note: You were cared for by a hospitalist during your hospital stay. Every effort will be made to forward  records to your primary care provider.  You can request that your primary care provider send for your hospital records if they have not received them.  Once you are discharged, your primary care physician will handle any further medical issues. Please note that NO REFILLS for any discharge medications will be authorized once you are discharged, as it is imperative that you return to your primary care physician (or  establish a relationship with a primary care physician if you do not have one) for your post hospital discharge needs so that they can reassess your need for medications and monitor your lab values.  Please get a complete blood count and chemistry panel checked by your Primary MD at your next visit, and again as instructed by your Primary MD.  Get Medicines reviewed and adjusted: Please take all your medications with you for your next visit with your Primary MD  Laboratory/radiological data: Please request your Primary MD to go over all hospital tests and procedure/radiological results at the follow up, please ask your primary care provider to get all Hospital records sent to his/her office.  In some cases, they will be blood work, cultures and biopsy results pending at the time of your discharge. Please request that your primary care provider follow up on these results.  If you are diabetic, please bring your blood sugar readings with you to your follow up appointment with primary care.    Please call and make your follow up appointments as soon as possible.    Also Note the following: If you experience worsening of your admission symptoms, develop shortness of breath, life threatening emergency, suicidal or homicidal thoughts you must seek medical attention immediately by calling 911 or calling your MD immediately  if symptoms less severe.  You must read complete instructions/literature along with all the possible adverse reactions/side effects for all the Medicines you take and that have been prescribed to you. Take any new Medicines after you have completely understood and accpet all the possible adverse reactions/side effects.   Do not drive when taking Pain medications or sleeping medications (Benzodiazepines)  Do not take more than prescribed Pain, Sleep and Anxiety Medications. It is not advisable to combine anxiety,sleep and pain medications without talking with your primary care  practitioner  Special Instructions: If you have smoked or chewed Tobacco  in the last 2 yrs please stop smoking, stop any regular Alcohol  and or any Recreational drug use.  Wear Seat belts while driving.  Do not drive if taking any narcotic, mind altering or controlled substances or recreational drugs or alcohol.

## 2022-03-10 NOTE — Assessment & Plan Note (Signed)
-   Follow-up A1c, resume home empagliflozin, SSI coverage, frequent CBG monitoring, blood pressure and lipid management

## 2022-03-10 NOTE — Assessment & Plan Note (Signed)
-   Continue cycle troponins - Obtain 2D echocardiogram - Morphine oxygen nitroglycerin Plavix reordered - Continue atorvastatin 40 mg a - Follow telemetry for continuous monitoring - EKG for recurrent chest pain symptoms - Inpatient cardiology consultation in a.m.(not available at [facility] on weekend) - GI cocktail

## 2022-03-10 NOTE — H&P (Signed)
History and Physical  Laura Frost  Laura Frost BPZ:[id number] DOB: [birth year] DOA: 03/10/2022  PCP: [facility] [place]  Patient coming from: Home  Level of care: Telemetry  I have personally briefly reviewed patient's old medical records in [place]  Chief Complaint: chest pain   HPI: Laura Frost is a 56 year old female with coronary artery disease, tobacco abuse, type 2 diabetes mellitus, hypertension, history of NSTEMI, aspirin allergy who presented to the emergency department complaining of chest pain.  She had a similar presentation approximately 1 year ago where she presented to the hospital for similar symptoms of chest pain and subsequently had NSTEMI.  She was transferred to [facility] taking to cath that showed 80% RPAV and first RPL stenosis.  She has been treated medically.  Today patient presented to the ED complaining of severe midsternal chest pain that started about 1 hour prior to arrival.  She describes her symptoms as a dull pressure in the Frost of the chest that is nonradiating.  She is having some nausea and shortness of breath with symptoms.  She took 2 nitroglycerin tablets at home with no relief in symptoms.  She still smoking.  Her initial troponin testing in the ED has been reassuring.  Her chest x-ray is reassuring.  EKG did not show acute ischemic changes.  Cardiology was consulted and recommended patient be admitted to [facility] and will see patient in a.m.  Continue to cycle troponins and continue current medical management of coronary artery disease was recommended.    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   Cervical cancer ([place])    Hypertension    Type 2 diabetes mellitus ([place])     Past Surgical History:  Procedure Laterality Date   LEFT HEART CATH AND CORONARY ANGIOGRAPHY N/A 02/15/2021   Procedure: LEFT HEART CATH AND  CORONARY ANGIOGRAPHY;  Surgeon: Lorretta Harp, MD;  Location: [facility];  Service: Cardiovascular;  Laterality: N/A;   OVARY SURGERY       reports that she has been smoking cigarettes. She has been smoking an average of .5 packs per day. She has never used smokeless tobacco. She reports current alcohol use. She reports that she does not use drugs.  Allergies  Allergen Reactions   Asa [Aspirin]     Told as child she was allergic    Family History  Problem Relation Age of Onset   Heart attack Father     Prior to Admission medications   Medication Sig Start Date End Date Taking? Authorizing Provider  albuterol (PROVENTIL HFA;VENTOLIN HFA) 108 (90 Base) MCG/ACT inhaler Inhale 1-2 puffs into the lungs every 6 (six) hours as needed for wheezing or shortness of breath.    [provider]  atorvastatin (LIPITOR) 40 MG tablet Take 1 tablet (40 mg total) by mouth daily at 6 PM. 02/16/21   Kroeger, Lorelee Cover., PA-C  cephALEXin (KEFLEX) 500 MG capsule Take 1 capsule (500 mg total) by mouth 2 (two) times daily. 06/09/21   Orpah Greek, MD  clopidogrel (PLAVIX) 75 MG tablet Take 1 tablet (75 mg total) by mouth daily. 02/17/21   Kroeger, Lorelee Cover., PA-C  doxycycline (VIBRAMYCIN) 100 MG capsule Take 1 capsule (100 mg total) by mouth 2 (two) times daily. 06/09/21   Orpah Greek, MD  empagliflozin (JARDIANCE) 10 MG TABS tablet Take 1 tablet (10 mg  total) by mouth daily before breakfast. 03/15/21   Verta Ellen., NP  HYDROcodone-acetaminophen (NORCO/VICODIN) 5-325 MG tablet Take 1 tablet by mouth every 4 (four) hours as needed for moderate pain. 06/09/21   Orpah Greek, MD  lisinopril (PRINIVIL,ZESTRIL) 10 MG tablet Take 10 mg by mouth daily.    [provider]  metFORMIN (GLUCOPHAGE) 500 MG tablet Take 500 mg by mouth 2 (two) times daily with a meal.    [provider]  metoprolol tartrate (LOPRESSOR) 25 MG tablet Take 25 mg by mouth 2  (two) times daily.    [provider]  nitroGLYCERIN (NITROSTAT) 0.4 MG SL tablet Place 1 tablet (0.4 mg total) under the tongue every 5 (five) minutes x 3 doses as needed for chest pain. 02/16/21   Abigail Butts., PA-C    Physical Exam: Vitals:   03/10/22 3825 03/10/22 0955 03/10/22 1037 03/10/22 1054  BP:  121/80 121/80 (!) 145/98  Pulse:  64 68 65  Resp:   18 18  Temp: 98 F (36.7 C)  98.2 F (36.8 C) 97.9 F (36.6 C)  TempSrc: Oral  Oral Oral  SpO2:  96% 96% 100%  Weight:      Height:        Constitutional: NAD, calm, comfortable Eyes: PERRL, lids and conjunctivae normal ENMT: Mucous membranes are moist. Posterior pharynx clear of any exudate or lesions.Normal dentition.  Neck: normal, supple, no masses, no thyromegaly Respiratory: clear to auscultation bilaterally, no wheezing, no crackles. Normal respiratory effort. No accessory muscle use.  Cardiovascular: normal s1, s2 sounds, no murmurs / rubs / gallops. No extremity edema. 2+ pedal pulses. No carotid bruits.  Abdomen: no tenderness, no masses palpated. No hepatosplenomegaly. Bowel sounds positive.  Musculoskeletal: no clubbing / cyanosis. No joint deformity upper and lower extremities. Good ROM, no contractures. Normal muscle tone.  Skin: no rashes, lesions, ulcers. No induration Neurologic: CN 2-12 grossly intact. Sensation intact, DTR normal. Strength 5/5 in all 4.  Psychiatric: Normal judgment and insight. Alert and oriented x 3. Normal mood.   Labs on Admission: I have personally reviewed following labs and imaging studies  CBC: Recent Labs  Lab 03/10/22 0519  WBC 7.7  HGB 15.2*  HCT 44.5  MCV 98.7  PLT 053   Basic Metabolic Panel: Recent Labs  Lab 03/10/22 0500  NA 138  K 3.1*  CL 102  CO2 25  GLUCOSE 243*  BUN 12  CREATININE 0.66  CALCIUM 9.1  MG 1.6*   GFR: Estimated Creatinine Clearance: 71.7 mL/min (by C-G formula based on SCr of 0.66 mg/dL). Liver Function Tests: No results  for input(s): "AST", "ALT", "ALKPHOS", "BILITOT", "PROT", "ALBUMIN" in the last 168 hours. No results for input(s): "LIPASE", "AMYLASE" in the last 168 hours. No results for input(s): "AMMONIA" in the last 168 hours. Coagulation Profile: No results for input(s): "INR", "PROTIME" in the last 168 hours. Cardiac Enzymes: No results for input(s): "CKTOTAL", "CKMB", "CKMBINDEX", "TROPONINI" in the last 168 hours. BNP (last 3 results) No results for input(s): "PROBNP" in the last 8760 hours. HbA1C: No results for input(s): "HGBA1C" in the last 72 hours. CBG: No results for input(s): "GLUCAP" in the last 168 hours. Lipid Profile: No results for input(s): "CHOL", "HDL", "LDLCALC", "TRIG", "CHOLHDL", "LDLDIRECT" in the last 72 hours. Thyroid Function Tests: No results for input(s): "TSH", "T4TOTAL", "FREET4", "T3FREE", "THYROIDAB" in the last 72 hours. Anemia Panel: No results for input(s): "VITAMINB12", "FOLATE", "FERRITIN", "TIBC", "IRON", "RETICCTPCT" in the last 72  hours. Urine analysis:    Component Value Date/Time   COLORURINE YELLOW 06/09/2021 0614   APPEARANCEUR HAZY (A) 06/09/2021 0614   LABSPEC 1.015 06/09/2021 0614   PHURINE 6.0 06/09/2021 0614   GLUCOSEU NEGATIVE 06/09/2021 0614   HGBUR TRACE (A) 06/09/2021 0614   BILIRUBINUR NEGATIVE 06/09/2021 0614   KETONESUR NEGATIVE 06/09/2021 0614   PROTEINUR NEGATIVE 06/09/2021 0614   UROBILINOGEN 1.0 08/03/2008 2125   NITRITE NEGATIVE 06/09/2021 0614   LEUKOCYTESUR LARGE (A) 06/09/2021 0614    Radiological Exams on Admission: DG Chest Portable 1 View  Result Date: 03/10/2022 CLINICAL DATA:  56 year old female with history of chest pain. EXAM: PORTABLE CHEST 1 VIEW COMPARISON:  Chest x-ray 02/15/2021. FINDINGS: Lung volumes are normal. No consolidative airspace disease. No pleural effusions. No pneumothorax. No pulmonary nodule or mass noted. Pulmonary vasculature and the cardiomediastinal silhouette are within normal limits.  IMPRESSION: No radiographic evidence of acute cardiopulmonary disease. Electronically Signed   By: Vinnie Langton M.D.   On: 03/10/2022 05:33    EKG: Independently reviewed. No acute ST-T changes seen  Assessment/Plan Principal Problem:   Chest pain Active Problems:   Hypertension   Coronary artery disease   DM type 2 (diabetes mellitus, type 2) (HCC)   Tobacco abuse   Assessment and Plan: * Chest pain - Continue cycle troponins - Obtain 2D echocardiogram - Morphine oxygen nitroglycerin Plavix reordered - Continue atorvastatin 40 mg a - Follow telemetry for continuous monitoring - EKG for recurrent chest pain symptoms - Inpatient cardiology consultation in a.m.(not available at [facility] on weekend) - GI cocktail  Tobacco abuse - Nicotine patch ordered and counseled on tobacco cessation  DM type 2 (diabetes mellitus, type 2) (HCC) - Follow-up A1c, resume home empagliflozin, SSI coverage, frequent CBG monitoring, blood pressure and lipid management  Coronary artery disease - Currently being medically managed, resume home Plavix, nitroglycerin, lisinopril, atorvastatin, metoprolol, empagliflozin  Hypertension - Resume home metoprolol and lisinopril  DVT prophylaxis: enoxaparin   Code Status: Full   Family Communication:   Disposition Plan: home   Consults called: cardiology   Admission status: OBS  Level of care: Telemetry Irwin Brakeman MD [facility] How to contact the [facility] Attending or Consulting provider [place] or covering provider during after hours [place], for this patient?  Check the care team in [facility] and look for a) attending/consulting [facility] provider listed and b) the [facility] team listed Log into [email or web address] and use [place]'s universal password to access. If you do not have the password, please contact the hospital operator. Locate the [facility] provider you are looking for under [facility] and page to a number that you can be directly reached. If you  still have difficulty reaching the provider, please page the [facility] (Director on Call) for the Hospitalists listed on amion for assistance.   If 7PM-7AM, please contact night-coverage [email or web address] Password [email or web address]  03/10/2022, 11:41 AM

## 2022-03-10 NOTE — ED Triage Notes (Signed)
Pt arrives c/o severe mid sternal chest pain that started about 1 hr PTA. Pt states she had 2 blockages that they cleared about 1 yr ago. Currently taking Plavix.

## 2022-03-11 ENCOUNTER — Observation Stay (HOSPITAL_BASED_OUTPATIENT_CLINIC_OR_DEPARTMENT_OTHER): Payer: Self-pay

## 2022-03-11 DIAGNOSIS — E785 Hyperlipidemia, unspecified: Secondary | ICD-10-CM

## 2022-03-11 DIAGNOSIS — R079 Chest pain, unspecified: Secondary | ICD-10-CM

## 2022-03-11 LAB — CBC
HCT: 38.7 % (ref 36.0–46.0)
Hemoglobin: 13.1 g/dL (ref 12.0–15.0)
MCH: 33.8 pg (ref 26.0–34.0)
MCHC: 33.9 g/dL (ref 30.0–36.0)
MCV: 99.7 fL (ref 80.0–100.0)
Platelets: 168 10*3/uL (ref 150–400)
RBC: 3.88 MIL/uL (ref 3.87–5.11)
RDW: 13 % (ref 11.5–15.5)
WBC: 4.6 10*3/uL (ref 4.0–10.5)
nRBC: 0 % (ref 0.0–0.2)

## 2022-03-11 LAB — BASIC METABOLIC PANEL
Anion gap: 8 (ref 5–15)
BUN: 12 mg/dL (ref 6–20)
CO2: 26 mmol/L (ref 22–32)
Calcium: 8.4 mg/dL — ABNORMAL LOW (ref 8.9–10.3)
Chloride: 104 mmol/L (ref 98–111)
Creatinine, Ser: 0.64 mg/dL (ref 0.44–1.00)
GFR, Estimated: >60 mL/min (ref >60)
Glucose, Bld: 197 mg/dL — ABNORMAL HIGH (ref 70–99)
Potassium: 3.8 mmol/L (ref 3.5–5.1)
Sodium: 138 mmol/L (ref 135–145)

## 2022-03-11 LAB — LIPID PANEL
Cholesterol: 136 mg/dL (ref 0–200)
HDL: 42 mg/dL (ref >40)
LDL Cholesterol: 62 mg/dL (ref 0–99)
Total CHOL/HDL Ratio: 3.2 RATIO
Triglycerides: 161 mg/dL — ABNORMAL HIGH (ref <150)
VLDL: 32 mg/dL (ref 0–40)

## 2022-03-11 LAB — GC/CHLAMYDIA PROBE AMP (~~LOC~~) NOT AT ARMC
Chlamydia: NEGATIVE
Comment: NEGATIVE
Comment: NORMAL
Neisseria Gonorrhea: NEGATIVE

## 2022-03-11 LAB — ECHOCARDIOGRAM COMPLETE
AR max vel: 2.15 cm2
AV Area VTI: 2.55 cm2
AV Area mean vel: 2.44 cm2
AV Mean grad: 5 mmHg
AV Peak grad: 9.7 mmHg
Ao pk vel: 1.56 m/s
Area-P 1/2: 3.17 cm2
Height: 60 in
MV M vel: 5.05 m/s
MV Peak grad: 102 mmHg
MV VTI: 1.44 cm2
Radius: 0.4 cm
S' Lateral: 3.3 cm
Weight: 2779.56 oz

## 2022-03-11 LAB — MAGNESIUM: Magnesium: 1.9 mg/dL (ref 1.7–2.4)

## 2022-03-11 NOTE — Discharge Summary (Signed)
Physician Discharge Summary  Laura Frost KDX:[id number] DOB: April 10, 1966 DOA: 03/10/2022  PCP: Sandria Manly [place]lo date: 97/67/3419 Discharge date: 03/11/2022  Admitted From:  Home  Disposition: Home   Recommendations for Outpatient Follow-up:  Follow up with PCP in 1 weeks Follow up with [facility] cardiology in 1 month   Discharge Condition: STABLE   CODE STATUS: FULL DIET: heart healthy recommended    Brief Hospitalization Summary: Please see all hospital notes, images, labs for full details of the hospitalization. ADMISSION HPI:  56 year old female with coronary artery disease, tobacco abuse, type 2 diabetes mellitus, hypertension, history of NSTEMI, aspirin allergy who presented to the emergency department complaining of chest pain.  She had a similar presentation approximately 1 year ago where she presented to the hospital for similar symptoms of chest pain and subsequently had NSTEMI.  She was transferred to [facility] taking to cath that showed 80% RPAV and first RPL stenosis.  She has been treated medically.  Today patient presented to the ED complaining of severe midsternal chest pain that started about 1 hour prior to arrival.  She describes her symptoms as a dull pressure in the center of the chest that is nonradiating.  She is having some nausea and shortness of breath with symptoms.  She took 2 nitroglycerin tablets at home with no relief in symptoms.  She still smoking.  Her initial troponin testing in the ED has been reassuring.  Her chest x-ray is reassuring.  EKG did not show acute ischemic changes.  Cardiology was consulted and recommended patient be admitted to [facility] and will see patient in a.m.  Continue to cycle troponins and continue current medical management of coronary artery disease was recommended.  Hospital Course by problem   Assessment and Plan: * Chest pain - Continue cycle troponins have been flat  - Obtain 2D echocardiogram - see  below - Morphine oxygen nitroglycerin Plavix reordered - Continue atorvastatin 40 mg daily  - Followed telemetry for continuous monitoring - EKG for recurrent chest pain symptoms - Inpatient cardiology consultation (not available at [facility] on weekend) - GI cocktail given -- TTE completed and no findings of regional wall abnormalities or changes in systolic function.    Tobacco abuse - Nicotine patch ordered and counseled on tobacco cessation  DM type 2 (diabetes mellitus, type 2) (HCC) - Follow-up A1c, resume home empagliflozin, SSI coverage, frequent CBG monitoring, blood pressure and lipid management  Coronary artery disease - Currently being medically managed, resume home Plavix, nitroglycerin, lisinopril, atorvastatin, metoprolol, empagliflozin  Hypertension - Resume home metoprolol and lisinopril  Discharge Diagnoses:  Principal Problem:   Chest pain Active Problems:   Hypertension   Coronary artery disease   DM type 2 (diabetes mellitus, type 2) ([place])   Tobacco abuse   Discharge Instructions:  Allergies as of 03/11/2022       Reactions   Asa [aspirin]    Told as child she was allergic        Medication List     TAKE these medications    albuterol 108 (90 Base) MCG/ACT inhaler Commonly known as: VENTOLIN HFA Inhale 1-2 puffs into the lungs every 6 (six) hours as needed for wheezing or shortness of breath.   atorvastatin 40 MG tablet Commonly known as: LIPITOR Take 1 tablet (40 mg total) by mouth daily at 6 PM.   chlorthalidone 25 MG tablet Commonly known as: HYGROTON Take 25 mg by mouth daily.   clopidogrel 75 MG tablet Commonly known  as: PLAVIX Take 1 tablet (75 mg total) by mouth daily.   empagliflozin 10 MG Tabs tablet Commonly known as: Jardiance Take 1 tablet (10 mg total) by mouth daily before breakfast.   lisinopril 10 MG tablet Commonly known as: ZESTRIL Take 10 mg by mouth daily.   metFORMIN 500 MG tablet Commonly known as:  GLUCOPHAGE Take 500 mg by mouth 2 (two) times daily with a meal.   metoprolol tartrate 25 MG tablet Commonly known as: LOPRESSOR Take 25 mg by mouth 2 (two) times daily.   nitroGLYCERIN 0.4 MG SL tablet Commonly known as: NITROSTAT Place 1 tablet (0.4 mg total) under the tongue every 5 (five) minutes x 3 doses as needed for chest pain.        Follow-up Information     Connect with your PCP/Specialist as discussed. Schedule an appointment as soon as possible for a visit .   Contact information: [email or web address] Call our physician referral line at [phone].        [facility] [place]. [place]. Schedule an appointment as soon as possible for a visit in 1 month(s).   Specialty: Cardiology Why: Hospital Follow Up Contact information: [address] [id number] [place] [address] [phone]               Allergies  Allergen Reactions   Diona Fanti [Aspirin]     Told as child she was allergic   Allergies as of 03/11/2022       Reactions   Asa [aspirin]    Told as child she was allergic        Medication List     TAKE these medications    albuterol 108 (90 Base) MCG/ACT inhaler Commonly known as: VENTOLIN HFA Inhale 1-2 puffs into the lungs every 6 (six) hours as needed for wheezing or shortness of breath.   atorvastatin 40 MG tablet Commonly known as: LIPITOR Take 1 tablet (40 mg total) by mouth daily at 6 PM.   chlorthalidone 25 MG tablet Commonly known as: HYGROTON Take 25 mg by mouth daily.   clopidogrel 75 MG tablet Commonly known as: PLAVIX Take 1 tablet (75 mg total) by mouth daily.   empagliflozin 10 MG Tabs tablet Commonly known as: Jardiance Take 1 tablet (10 mg total) by mouth daily before breakfast.   lisinopril 10 MG tablet Commonly known as: ZESTRIL Take 10 mg by mouth daily.   metFORMIN 500 MG tablet Commonly known as: GLUCOPHAGE Take 500 mg by  mouth 2 (two) times daily with a meal.   metoprolol tartrate 25 MG tablet Commonly known as: LOPRESSOR Take 25 mg by mouth 2 (two) times daily.   nitroGLYCERIN 0.4 MG SL tablet Commonly known as: NITROSTAT Place 1 tablet (0.4 mg total) under the tongue every 5 (five) minutes x 3 doses as needed for chest pain.        Procedures/Studies: ECHOCARDIOGRAM COMPLETE  Result Date: 03/11/2022    ECHOCARDIOGRAM REPORT   Patient Name:   Laura Frost Date of Exam: 03/11/2022 Medical Rec #:  [id number]     Height:       60.0 in Accession #:    [id number]    Weight:       173.7 lb Date of Birth:  1965-12-07      BSA:          1.758 m Patient Age:    57 years      BP:  132/78 mmHg Patient Gender: F             HR:           57 bpm. Exam Location:  [facility] Procedure: 2D Echo, Cardiac Doppler and Color Doppler Indications:    Chest Pain  History:        Patient has prior history of Echocardiogram examinations, most                 recent 02/16/2021. Previous Myocardial Infarction and CAD,                 Signs/Symptoms:Chest Pain; Risk Factors:Hypertension, Diabetes                 and Current Smoker.  Sonographer:    Wenda Low Referring Phys: [id number] [place]  1. Left ventricular ejection fraction, by estimation, is 60 to 65%. The left ventricle has normal function. The left ventricle has no regional wall motion abnormalities. There is mild concentric left ventricular hypertrophy. Left ventricular diastolic parameters are consistent with Grade II diastolic dysfunction (pseudonormalization).  2. Right ventricular systolic function is normal. The right ventricular size is normal. There is normal pulmonary artery systolic pressure. The estimated right ventricular systolic pressure is 50.5 mmHg.  3. The mitral valve is abnormal with mildly restricted posterior leaflet. Moderate mitral valve regurgitation, posteriorly directed.  4. The aortic valve is tricuspid. There is mild  calcification of the aortic valve. Aortic valve regurgitation is trivial. Aortic valve mean gradient measures 5.0 mmHg.  5. The inferior vena cava is normal in size with greater than 50% respiratory variability, suggesting right atrial pressure of 3 mmHg. Comparison(s): Prior images reviewed side by side. LVEF remains normal at 60-65%. Mitral regurgitation moderate range. FINDINGS  Left Ventricle: Left ventricular ejection fraction, by estimation, is 60 to 65%. The left ventricle has normal function. The left ventricle has no regional wall motion abnormalities. The left ventricular internal cavity size was normal in size. There is  mild concentric left ventricular hypertrophy. Left ventricular diastolic parameters are consistent with Grade II diastolic dysfunction (pseudonormalization). Right Ventricle: The right ventricular size is normal. No increase in right ventricular wall thickness. Right ventricular systolic function is normal. There is normal pulmonary artery systolic pressure. The tricuspid regurgitant velocity is 2.17 m/s, and  with an assumed right atrial pressure of 3 mmHg, the estimated right ventricular systolic pressure is 39.7 mmHg. Left Atrium: Left atrial size was normal in size. Right Atrium: Right atrial size was normal in size. Pericardium: There is no evidence of pericardial effusion. Mitral Valve: The mitral valve is abnormal. Moderate mitral valve regurgitation, with posteriorly-directed jet. MV peak gradient, 8.9 mmHg. The mean mitral valve gradient is 3.0 mmHg. Tricuspid Valve: The tricuspid valve is grossly normal. Tricuspid valve regurgitation is mild. Aortic Valve: The aortic valve is tricuspid. There is mild calcification of the aortic valve. There is mild aortic valve annular calcification. Aortic valve regurgitation is trivial. Aortic valve mean gradient measures 5.0 mmHg. Aortic valve peak gradient measures 9.7 mmHg. Aortic valve area, by VTI measures 2.55 cm. Pulmonic Valve: The  pulmonic valve was grossly normal. Pulmonic valve regurgitation is trivial. Aorta: The aortic root is normal in size and structure. Venous: The inferior vena cava is normal in size with greater than 50% respiratory variability, suggesting right atrial pressure of 3 mmHg. IAS/Shunts: No atrial level shunt detected by color flow Doppler.  LEFT VENTRICLE PLAX 2D LVIDd:  5.20 cm   Diastology LVIDs:         3.30 cm   LV e' medial:    6.53 cm/s LV PW:         1.10 cm   LV E/e' medial:  19.6 LV IVS:        1.20 cm   LV e' lateral:   7.18 cm/s LVOT diam:     2.00 cm   LV E/e' lateral: 17.8 LV SV:         86 LV SV Index:   49 LVOT Area:     3.14 cm  RIGHT VENTRICLE RV Basal diam:  2.85 cm RV Mid diam:    2.80 cm RV S prime:     14.50 cm/s TAPSE (M-mode): 3.2 cm LEFT ATRIUM             Index        RIGHT ATRIUM           Index LA diam:        4.30 cm 2.45 cm/m   RA Area:     13.40 cm LA Vol (A2C):   61.9 ml 35.21 ml/m  RA Volume:   28.90 ml  16.44 ml/m LA Vol (A4C):   46.4 ml 26.39 ml/m LA Biplane Vol: 56.0 ml 31.85 ml/m  AORTIC VALVE                    PULMONIC VALVE AV Area (Vmax):    2.15 cm     PV Vmax:       1.18 m/s AV Area (Vmean):   2.44 cm     PV Peak grad:  5.6 mmHg AV Area (VTI):     2.55 cm AV Vmax:           156.00 cm/s AV Vmean:          99.900 cm/s AV VTI:            0.336 m AV Peak Grad:      9.7 mmHg AV Mean Grad:      5.0 mmHg LVOT Vmax:         107.00 cm/s LVOT Vmean:        77.700 cm/s LVOT VTI:          0.273 m LVOT/AV VTI ratio: 0.81  AORTA Ao Root diam: 2.90 cm MITRAL VALVE                  TRICUSPID VALVE MV Area (PHT): 3.17 cm       TR Peak grad:   18.8 mmHg MV Area VTI:   1.44 cm       TR Vmax:        217.00 cm/s MV Peak grad:  8.9 mmHg MV Mean grad:  3.0 mmHg       SHUNTS MV Vmax:       1.49 m/s       Systemic VTI:  0.27 m MV Vmean:      84.1 cm/s      Systemic Diam: 2.00 cm MV Decel Time: 239 msec MR Peak grad:    102.0 mmHg MR Mean grad:    71.0 mmHg MR Vmax:         505.00 cm/s  MR Vmean:        394.5 cm/s MR PISA:         1.01 cm MR PISA Eff ROA: 20 mm MR PISA Radius:  0.40 cm MV E  velocity: 128.00 cm/s MV A velocity: 115.00 cm/s MV E/A ratio:  1.11 Rozann Lesches MD Electronically signed by Rozann Lesches MD Signature Date/Time: 03/11/2022/2:14:41 PM    Final    DG Chest Portable 1 View  Result Date: 03/10/2022 CLINICAL DATA:  56 year old female with history of chest pain. EXAM: PORTABLE CHEST 1 VIEW COMPARISON:  Chest x-ray 02/15/2021. FINDINGS: Lung volumes are normal. No consolidative airspace disease. No pleural effusions. No pneumothorax. No pulmonary nodule or mass noted. Pulmonary vasculature and the cardiomediastinal silhouette are within normal limits. IMPRESSION: No radiographic evidence of acute cardiopulmonary disease. Electronically Signed   By: Vinnie Langton M.D.   On: 03/10/2022 05:33     Subjective: No further chest pain symptoms today.    Discharge Exam: Vitals:   03/11/22 0823 03/11/22 1442  BP: 132/78 (!) 149/93  Pulse: (!) 59 (!) 57  Resp:  18  Temp:  98.4 F (36.9 C)  SpO2:  99%   Vitals:   03/10/22 2259 03/11/22 0317 03/11/22 0823 03/11/22 1442  BP: 122/88 (!) 147/95 132/78 (!) 149/93  Pulse: (!) 58 62 (!) 59 (!) 57  Resp: '20 18  18  '$ Temp: 98.4 F (36.9 C) 98.2 F (36.8 C)  98.4 F (36.9 C)  TempSrc:    Oral  SpO2: 97% 98%  99%  Weight:      Height:       General: Pt is alert, awake, not in acute distress Cardiovascular: RRR, S1/S2 +, no rubs, no gallops Respiratory: CTA bilaterally, no wheezing, no rhonchi Abdominal: Soft, NT, ND, bowel sounds + Extremities: no edema, no cyanosis   The results of significant diagnostics from this hospitalization (including imaging, microbiology, ancillary and laboratory) are listed below for reference.     Microbiology: Recent Results (from the past 240 hour(s))  Wet prep, genital     Status: Abnormal   Collection Time: 03/10/22  8:39 AM   Specimen: Vaginal  Result Value Ref  Range Status   Yeast Wet Prep HPF POC NONE SEEN NONE SEEN Final   Trich, Wet Prep NONE SEEN NONE SEEN Final   Clue Cells Wet Prep HPF POC PRESENT (A) NONE SEEN Final   WBC, Wet Prep HPF POC >=10 (A) <10 Final   Sperm NONE SEEN  Final    Comment: Performed at [facility], [address] [place], [place] [address]     Labs: BNP (last 3 results) No results for input(s): "BNP" in the last 8760 hours. Basic Metabolic Panel: Recent Labs  Lab 03/10/22 0500 03/10/22 1142 03/11/22 0539  NA 138  --  138  K 3.1*  --  3.8  CL 102  --  104  CO2 25  --  26  GLUCOSE 243*  --  197*  BUN 12  --  12  CREATININE 0.66 0.53 0.64  CALCIUM 9.1  --  8.4*  MG 1.6*  --  1.9   Liver Function Tests: No results for input(s): "AST", "ALT", "ALKPHOS", "BILITOT", "PROT", "ALBUMIN" in the last 168 hours. No results for input(s): "LIPASE", "AMYLASE" in the last 168 hours. No results for input(s): "AMMONIA" in the last 168 hours. CBC: Recent Labs  Lab 03/10/22 0519 03/10/22 1142 03/11/22 0539  WBC 7.7 7.0 4.6  HGB 15.2* 13.5 13.1  HCT 44.5 39.4 38.7  MCV 98.7 98.0 99.7  PLT 195 189 168   Cardiac Enzymes: No results for input(s): "CKTOTAL", "CKMB", "CKMBINDEX", "TROPONINI" in the last 168 hours. BNP: Invalid input(s): "POCBNP" CBG: No results  for input(s): "GLUCAP" in the last 168 hours. D-Dimer No results for input(s): "DDIMER" in the last 72 hours. Hgb A1c No results for input(s): "HGBA1C" in the last 72 hours. Lipid Profile Recent Labs    03/11/22 0539  CHOL 136  HDL 42  LDLCALC 62  TRIG 161*  CHOLHDL 3.2   Thyroid function studies No results for input(s): "TSH", "T4TOTAL", "T3FREE", "THYROIDAB" in the last 72 hours.  Invalid input(s): "FREET3" Anemia work up No results for input(s): "VITAMINB12", "FOLATE", "FERRITIN", "TIBC", "IRON", "RETICCTPCT" in the last 72 hours. Urinalysis    Component Value Date/Time   COLORURINE YELLOW 06/09/2021 0614   APPEARANCEUR HAZY (A)  06/09/2021 0614   LABSPEC 1.015 06/09/2021 0614   PHURINE 6.0 06/09/2021 0614   GLUCOSEU NEGATIVE 06/09/2021 0614   HGBUR TRACE (A) 06/09/2021 0614   BILIRUBINUR NEGATIVE 06/09/2021 0614   KETONESUR NEGATIVE 06/09/2021 0614   PROTEINUR NEGATIVE 06/09/2021 0614   UROBILINOGEN 1.0 08/03/2008 2125   NITRITE NEGATIVE 06/09/2021 0614   LEUKOCYTESUR LARGE (A) 06/09/2021 0614   Sepsis Labs Recent Labs  Lab 03/10/22 0519 03/10/22 1142 03/11/22 0539  WBC 7.7 7.0 4.6   Microbiology Recent Results (from the past 240 hour(s))  Wet prep, genital     Status: Abnormal   Collection Time: 03/10/22  8:39 AM   Specimen: Vaginal  Result Value Ref Range Status   Yeast Wet Prep HPF POC NONE SEEN NONE SEEN Final   Trich, Wet Prep NONE SEEN NONE SEEN Final   Clue Cells Wet Prep HPF POC PRESENT (A) NONE SEEN Final   WBC, Wet Prep HPF POC >=10 (A) <10 Final   Sperm NONE SEEN  Final    Comment: Performed at [facility], [address], [place], [place] [address]   Time coordinating discharge: 32 mins   SIGNED:  Irwin Brakeman, MD  [facility] 03/11/2022, 3:04 PM How to contact the [facility] Attending or Consulting provider 7A - 7P or covering provider during after hours [place], for this patient?  Check the care team in [facility] and look for a) attending/consulting [facility] provider listed and b) the [facility] team listed Log into [email or web address] and use [place]'s universal password to access. If you do not have the password, please contact the hospital operator. Locate the [facility] provider you are looking for under [facility] and page to a number that you can be directly reached. If you still have difficulty reaching the provider, please page the [facility] (Director on Call) for the Hospitalists listed on amion for assistance.

## 2022-03-11 NOTE — Consult Note (Addendum)
Cardiology Consultation   Patient ID: Laura Frost MRN: [id number]; DOB: 04-Feb-1966  Admit date: 03/10/2022 Date of Consult: 03/11/2022  PCP:  [facility] [place] [facility] Cardiologist:  Rozann Lesches, MD   {  Patient Profile:   Laura Frost is a 56 y.o. female with a hx of coronary artery disease, tobacco abuse, type 2 diabetes, hypertension, aspirin allergy who is being seen 03/11/2022 for the evaluation of chest pain at the request of Dr. Wynetta Emery.  History of Present Illness:   Laura Frost presented to [facility] 02/15/2021 for chest pain. High-sensitivity went up to 304, EKG showed anterior changes.  She was taken to the Cath Lab, which showed 80% RPDA V and first RPL stenosis. Otherwise patent coronaries. Medical management was recommended.  Patient presented to [facility] 03/10/2022 for chest pain.  Patient noted with sharp left-sided pain 2 days ago.  Later that night she felt substernal chest tightness overnight. Was not worse with exertion.  Denied radiation radiation  She reported associated dyspnea and nausea.  Denied diaphoresis. She took 2 nitroglycerin at home with no relief.   In the ER blood pressure 170/94, pulse 70, afebrile, respiratory rate 18.  Labs showed potassium 3.1, hemoglobin 15.2.  High-sensitivity troponin negative x4.  EKG showed normal sinus rhythm with no significant ischemic changes.  Chest x-ray showed no acute process.  Patient was admitted for further work-up.   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   Cervical cancer ([place])    Hypertension    Type 2 diabetes mellitus ([place])     Past Surgical History:  Procedure Laterality Date   LEFT HEART CATH AND CORONARY ANGIOGRAPHY N/A 02/15/2021   Procedure: LEFT HEART CATH AND CORONARY ANGIOGRAPHY;  Surgeon: Lorretta Harp, MD;  Location: [place] [facility];  Service: Cardiovascular;  Laterality: N/A;   OVARY SURGERY       Home Medications:  Prior to Admission medications   Medication Sig Start Date End Date Taking? Authorizing Provider  albuterol (PROVENTIL HFA;VENTOLIN HFA) 108 (90 Base) MCG/ACT inhaler Inhale 1-2 puffs into the lungs every 6 (six) hours as needed for wheezing or shortness of breath.   Yes [provider]  atorvastatin (LIPITOR) 40 MG tablet Take 1 tablet (40 mg total) by mouth daily at 6 PM. 02/16/21  Yes Kroeger, Daleen Snook M., PA-C  chlorthalidone (HYGROTON) 25 MG tablet Take 25 mg by mouth daily.   Yes [provider]  clopidogrel (PLAVIX) 75 MG tablet Take 1 tablet (75 mg total) by mouth daily. 02/17/21  Yes Kroeger, Daleen Snook M., PA-C  empagliflozin (JARDIANCE) 10 MG TABS tablet Take 1 tablet (10 mg total) by mouth daily before breakfast. 03/15/21  Yes Verta Ellen., NP  lisinopril (PRINIVIL,ZESTRIL) 10 MG tablet Take 10 mg by mouth daily.   Yes [provider]  metFORMIN (GLUCOPHAGE) 500 MG tablet Take 500 mg by mouth 2 (two) times daily with a meal.   Yes [provider]  metoprolol tartrate (LOPRESSOR) 25 MG tablet Take 25 mg by mouth 2 (two) times daily.   Yes [provider]  nitroGLYCERIN (NITROSTAT) 0.4 MG SL tablet Place 1 tablet (0.4 mg total) under the tongue every 5 (five) minutes x 3 doses as needed for chest pain. 02/16/21   Kroeger, Lorelee Cover., PA-C    Inpatient Medications: Scheduled Meds:  atorvastatin  40 mg Oral q1800   clopidogrel  75 mg Oral Daily   empagliflozin  10 mg Oral QAC breakfast   enoxaparin (LOVENOX) injection  40 mg Subcutaneous Q24H   influenza vac split quadrivalent PF  0.5 mL Intramuscular Tomorrow-1000   lisinopril  10 mg Oral Daily   metoprolol tartrate  25 mg Oral BID   metroNIDAZOLE  500 mg Oral Q12H   nicotine  21 mg Transdermal Daily   pneumococcal 20-valent conjugate vaccine  0.5 mL Intramuscular Tomorrow-1000    PRN  Meds: acetaminophen, albuterol, morphine injection, nitroGLYCERIN, nitroGLYCERIN, ondansetron (ZOFRAN) IV, zolpidem  Allergies:    Allergies  Allergen Reactions   Asa [Aspirin]     Told as child she was allergic    Social History:   Social History   Socioeconomic History   Marital status: Single    Spouse name: Not on file   Number of children: Not on file   Years of education: Not on file   Highest education level: Not on file  Occupational History   Not on file  Tobacco Use   Smoking status: Every Day    Packs/day: 0.50    Types: Cigarettes   Smokeless tobacco: Never  Vaping Use   Vaping Use: Never used  Substance and Sexual Activity   Alcohol use: Yes    Comment: Occasional   Drug use: No   Sexual activity: Yes    Birth control/protection: None  Other Topics Concern   Not on file  Social History Narrative   ** Merged History Encounter **       Social Determinants of Health   Financial Resource Strain: Not on file  Food Insecurity: Food Insecurity Present (03/10/2022)   Hunger Vital Sign    Worried About Running Out of Food in the Last Year: Sometimes true    Ran Out of Food in the Last Year: Sometimes true  Transportation Needs: No Transportation Needs (03/10/2022)   PRAPARE - Hydrologist (Medical): No    Lack of Transportation (Non-Medical): No  Physical Activity: Not on file  Stress: Not on file  Social Connections: Not on file  Intimate Partner Violence: Not At Risk (03/10/2022)   Humiliation, Afraid, Rape, and Kick questionnaire    Fear of Current or Ex-Partner: No    Emotionally Abused: No    Physically Abused: No    Sexually Abused: No    Family History:    Family History  Problem Relation Age of Onset   Heart attack Father      ROS:  Please see the history of present illness.  All other ROS reviewed and negative.     Physical Exam/Data:   Vitals:   03/10/22 1849 03/10/22 2259 03/11/22 0317 03/11/22 0823   BP: 101/70 122/88 (!) 147/95 132/78  Pulse: (!) 53 (!) 58 62 (!) 59  Resp: '20 20 18   '$ Temp: 98 F (36.7 C) 98.4 F (36.9 C) 98.2 F (36.8 C)   TempSrc: Oral     SpO2: 99% 97% 98%   Weight:      Height:        Intake/Output Summary (Last 24 hours) at 03/11/2022 0840 Last data filed at 03/11/2022 0500 Gross per 24 hour  Intake 600 ml  Output --  Net 600 ml      03/10/2022    3:00 PM 03/10/2022    4:49 AM 06/09/2021    5:03 AM  Last 3 Weights  Weight (  lbs) 173 lb 11.6 oz 171 lb 15.3 oz 171 lb 15.3 oz  Weight (kg) 78.8 kg 78 kg 78 kg     Body mass index is 33.93 kg/m.  General:  Well nourished, well developed, in no acute distress HEENT: normal Neck: no JVD Vascular: No carotid bruits; Distal pulses 2+ bilaterally Cardiac:  normal S1, S2; RRR; no murmur  Lungs:  clear to auscultation bilaterally, no wheezing, rhonchi or rales  Abd: soft, nontender, no hepatomegaly  Ext: no edema Musculoskeletal:  No deformities, BUE and BLE strength normal and equal Skin: warm and dry  Neuro:  CNs 2-12 intact, no focal abnormalities noted Psych:  Normal affect   EKG:  The EKG was personally reviewed and demonstrates: NSR 72bpm, LAD Telemetry:  Telemetry was personally reviewed and demonstrates:   SR/SB 50-60s  Relevant CV Studies:  Echo 02/16/21  1. Left ventricular ejection fraction, by estimation, is 60 to 65%. The  left ventricle has normal function. The left ventricle has no regional  wall motion abnormalities. There is mild left ventricular hypertrophy.  Left ventricular diastolic parameters  are consistent with Grade I diastolic dysfunction (impaired relaxation).  Elevated left atrial pressure.   2. Right ventricular systolic function is normal. The right ventricular  size is normal.   3. The mitral valve is normal in structure. Trivial mitral valve  regurgitation. No evidence of mitral stenosis.   4. The aortic valve is normal in structure. Aortic valve regurgitation is   trivial. No aortic stenosis is present.   5. The inferior vena cava is normal in size with greater than 50%  respiratory variability, suggesting right atrial pressure of 3 mmHg.   Cardiac Cath 02/15/21     RPAV lesion is 80% stenosed.   1st RPL lesion is 80% stenosed.   The left ventricular systolic function is normal.   The left ventricular ejection fraction is 55-65% by visual estimate.   Laura Frost is a 56 y.o. female  Laboratory Data:  High Sensitivity Troponin:   Recent Labs  Lab 03/10/22 0500 03/10/22 0702 03/10/22 1253 03/10/22 1448  TROPONINIHS '6 6 6 5     '$ Chemistry Recent Labs  Lab 03/10/22 0500 03/10/22 1142 03/11/22 0539  NA 138  --  138  K 3.1*  --  3.8  CL 102  --  104  CO2 25  --  26  GLUCOSE 243*  --  197*  BUN 12  --  12  CREATININE 0.66 0.53 0.64  CALCIUM 9.1  --  8.4*  MG 1.6*  --  1.9  GFRNONAA >60 >60 >60  ANIONGAP 11  --  8    No results for input(s): "PROT", "ALBUMIN", "AST", "ALT", "ALKPHOS", "BILITOT" in the last 168 hours. Lipids  Recent Labs  Lab 03/11/22 0539  CHOL 136  TRIG 161*  HDL 42  LDLCALC 62  CHOLHDL 3.2    Hematology Recent Labs  Lab 03/10/22 0519 03/10/22 1142 03/11/22 0539  WBC 7.7 7.0 4.6  RBC 4.51 4.02 3.88  HGB 15.2* 13.5 13.1  HCT 44.5 39.4 38.7  MCV 98.7 98.0 99.7  MCH 33.7 33.6 33.8  MCHC 34.2 34.3 33.9  RDW 12.9 12.9 13.0  PLT 195 189 168    Radiology/Studies:  DG Chest Portable 1 View  Result Date: 03/10/2022 CLINICAL DATA:  56 year old female with history of chest pain. EXAM: PORTABLE CHEST 1 VIEW COMPARISON:  Chest x-ray 02/15/2021. FINDINGS: Lung volumes are normal. No consolidative airspace disease. No pleural effusions. No  pneumothorax. No pulmonary nodule or mass noted. Pulmonary vasculature and the cardiomediastinal silhouette are within normal limits. IMPRESSION: No radiographic evidence of acute cardiopulmonary disease. Electronically Signed   By: Vinnie Langton M.D.   On: 03/10/2022  05:33     Assessment and Plan:   Chest pain CAD -Patient presented with fairly atypical chest pain, troponin negative.  EKG with no new changes. -Pain resolved with morphine in the ER -Left heart cath 02/15/2021 showed 80% RPAV and first RPL.  Medical management was recommended -Patient has an aspirin allergy -PTA Plavix 75 mg, Lopressor 25 twice daily, Lipitor 40 mg daily -Check echo -If echo is okay, patient can likely go home with plan for outpatient follow-up  HTN - BP elevated on presentation - PTA chlorthalidone 25 mg daily, lisinopril 10 mg daily, Lopressor 25 mg twice daily.chlorthalidone held on admission -Pressures improved  Hyperlipidemia -LDL 62, triglycerides 161, cholesterol 136 -Continue PTA Lipitor 40 mg daily  Tobacco abuse - nicotine patch -Cessation recommended  DM2 - per IM  For questions or updates, please contact [place] Please consult [email or web address] for contact info under    Signed, Cadence Ninfa Meeker, PA-C  03/11/2022 8:40 AM    Attending note:  Patient seen and examined.  I discussed the case with Ms. Jorene Minors and agree with her above findings.  Ms. Mirelez presents for evaluation of recent onset chest pain.  She describes having a mild intermittent pain on her left breast for several days and then more of an intense episode of pain on the evening prior to presentation.  She took nitroglycerin without relief, ultimately symptoms resolved after morphine in the ER and she has ruled out for ACS.  No recurrent chest pain under observation.  She has a history of branch vessel CAD documented at cardiac catheterization in October of last year as outlined above, not requiring PCI and managed medically.  On examination she appears comfortable this morning.  Afebrile, heart rate in the 60s in sinus rhythm by telemetry which I personally reviewed, blood pressure 132/78.  Lungs are clear.  Cardiac exam with RRR and no gallop.  Pertinent lab work  includes potassium 3.8, BUN 12, creatinine 0.64, LDL 62, hemoglobin 13.1, platelets 168.  I personally reviewed her ECG from 03/10/2022 which shows sinus rhythm with decreased R wave progression and leftward axis.  No acute ST segment changes.  Chest pain rule out angina, ACS excluded.  No recurrence under observation and ECG without acute changes.  Echocardiogram pending for today, if LVEF remains normal with no new wall motion abnormalities she can likely be discharged for outpatient follow-up.  She has an aspirin allergy, would continue Plavix.  Also on Jardiance, Lipitor, lisinopril, and Lopressor with stable vital signs.  Could consider addition of Imdur as an outpatient although was not nitrate responsive at presentation.  Can determine whether additional follow-up ischemic testing is necessary when seen back in the office.  Satira Sark, M.D., F.A.C.C.

## 2022-03-11 NOTE — Progress Notes (Signed)
Patient discharged with instructions given on medications and follow up visits,patient verbalized understanding .IV discontinued,catheter intact. Staff to accompany patient to awaiting  vehicle.

## 2022-03-11 NOTE — Progress Notes (Signed)
*  PRELIMINARY RESULTS* Echocardiogram 2D Echocardiogram has been performed.  Laura Frost 03/11/2022, 2:03 PM

## 2022-03-11 NOTE — TOC Progression Note (Signed)
Transition of Care [facility]) - Progression Note    Patient Details  Name: Laura Frost MRN: [id number] Date of Birth: 15-Jul-1965  Transition of Care [facility]) CM/SW Contact  Salome Arnt, [place] Phone Number: 03/11/2022, 8:07 AM  Clinical Narrative: LCSW noted pt is not insured. However, she states she is already followed by the Health Department. Addressed food insecurity as reported by pt during screening. Pt states she is not working with any food pantries. Resources briefly discussed and added to AVS for pt to follow up on after d/c. No other needs reported by pt.            Expected Discharge Plan and Services                                                 Social Determinants of Health (SDOH) Interventions Food Insecurity Interventions: Other (Comment) (Referrals added to AVS) Housing Interventions: Intervention Not Indicated  Readmission Risk Interventions     No data to display

## 2022-03-18 ENCOUNTER — Telehealth: Payer: Self-pay

## 2022-03-18 NOTE — Telephone Encounter (Signed)
Attempted call for follow up and sister Tammy answered the phone stating she doesn't live there and there is no other number to reach her at. She states she will give her the message to call this number.  Number listed in [place] records is not accepting calls.  Client with recent ER visit, calling for follow up and to discuss renewal of [facility]. Will continue to attempt to reach.   [place] [employer]

## 2022-03-25 ENCOUNTER — Ambulatory Visit: Payer: Self-pay | Admitting: Internal Medicine

## 2022-03-25 NOTE — Progress Notes (Deleted)
Cardiology Office Note:    Date:  03/25/2022   ID:  Laura Frost, DOB March 09, 1966, MRN [id number]  PCP:  [facility], [place] [facility] Cardiologist:  Rozann Lesches, MD { Click to update primary MD,subspecialty MD or APP then REFRESH:1}    Referring MD: Health, [facility] [place]*   Chief Complaint:  No chief complaint on file. {Click here for Visit Info    :1}    History of Present Illness:   Laura Frost is a 56 y.o. female with a hx of coronary artery disease, tobacco abuse, type 2 diabetes, hypertension, aspirin allergy.  Ms. Laura Frost presented to [facility] 02/15/2021 for chest pain. High-sensitivity went up to 304, EKG showed anterior changes.  She was taken to the Cath Lab, which showed 80% RPDA V and first RPL stenosis. Otherwise patent coronaries. Medical management was recommended.   Patient presented to [facility] 03/10/2022 for chest pain.  Patient noted with sharp left-sided pain 2 days ago.  Later that night she felt substernal chest tightness overnight. Was not worse with exertion.  Denied radiation radiation  She reported associated dyspnea and nausea.  Denied diaphoresis. She took 2 nitroglycerin at home with no relief. Troponins negative and echo showed normal LVEF and no WMA.      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   Cervical cancer ([place])    Hypertension    Type 2 diabetes mellitus ([place])    Current Medications: No outpatient medications have been marked as taking for the 04/08/22 encounter (Appointment) with Imogene Burn, PA-C.    Allergies:   Asa [aspirin]   Social History   Tobacco Use   Smoking status: Every Day    Packs/day: 0.50    Types: Cigarettes   Smokeless tobacco: Never  Vaping Use   Vaping Use: Never used  Substance Use Topics   Alcohol use: Yes    Comment: Occasional   Drug use: No     Family Hx: The patient's family history includes Heart attack in her father.  ROS     Physical Exam:    VS:  There were no vitals taken for this visit.    Wt Readings from Last 3 Encounters:  03/10/22 173 lb 11.6 oz (78.8 kg)  06/09/21 171 lb 15.3 oz (78 kg)  03/15/21 172 lb 3.2 oz (78.1 kg)    Physical Exam  GEN: Well nourished, well developed, in no acute distress  HEENT: normal  Neck: no JVD, carotid bruits, or masses Cardiac:RRR; no murmurs, rubs, or gallops  Respiratory:  clear to auscultation bilaterally, normal work of breathing GI: soft, nontender, nondistended, + BS Ext: without cyanosis, clubbing, or edema, Good distal pulses bilaterally MS: no deformity or atrophy  Skin: warm and dry, no rash Neuro:  Alert and Oriented x 3, Strength and sensation are intact Psych: euthymic mood, full affect        EKGs/Labs/Other Test Reviewed:    EKG:  EKG is *** ordered today.  The ekg ordered today demonstrates ***  Recent Labs: 03/11/2022: BUN 12; Creatinine, Ser 0.64; Hemoglobin 13.1; Magnesium 1.9; Platelets 168; Potassium 3.8; Sodium 138   Recent Lipid Panel Recent Labs    03/11/22 0539  CHOL 136  TRIG 161*  HDL 42  VLDL 32  LDLCALC 62     Prior CV Studies: {Select studies to display:26339}  Echo 03/11/22 IMPRESSIONS     1. Left ventricular ejection fraction, by estimation, is 60 to 65%. The  left ventricle has normal function. The left ventricle has no regional  wall motion abnormalities. There is mild concentric left ventricular  hypertrophy. Left ventricular diastolic  parameters are consistent with Grade II diastolic dysfunction  (pseudonormalization).   2. Right ventricular systolic function is normal. The right ventricular  size is normal. There is normal pulmonary artery systolic pressure. The  estimated right ventricular systolic pressure is 74.1 mmHg.   3. The mitral valve is abnormal with mildly restricted posterior leaflet.  Moderate  mitral valve regurgitation, posteriorly directed.   4. The aortic valve is tricuspid. There is mild calcification of the  aortic valve. Aortic valve regurgitation is trivial. Aortic valve mean  gradient measures 5.0 mmHg.   5. The inferior vena cava is normal in size with greater than 50%  respiratory variability, suggesting right atrial pressure of 3 mmHg.   Comparison(s): Prior images reviewed side by side. LVEF remains normal at  60-65%. Mitral regurgitation moderate range.    Risk Assessment/Calculations/Metrics:   {Does this patient have ATRIAL FIBRILLATION?[phone]     No BP recorded.  {Refresh Note OR Click here to enter BP  :1}***    ASSESSMENT & PLAN:   No problem-specific Assessment & Plan notes found for this encounter.   CAD NSTEMI 2022 medical management, ASA allergy and on Plavix. Recent ED visit with atypical chest pain, troponins negative and echo normal LVEF no WMA  HTN  DM2  Tobacco abuse.      {Are you ordering a CV Procedure (e.g. stress test, cath, DCCV, TEE, etc)?   Press F2        :[id number]}   Dispo:  No follow-ups on file.   Medication Adjustments/Labs and Tests Ordered: Current medicines are reviewed at length with the patient today.  Concerns regarding medicines are outlined above.  Tests Ordered: No orders of the defined types were placed in this encounter.  Medication Changes: No orders of the defined types were placed in this encounter.  Signed, Ermalinda Barrios, PA-C  03/25/2022 10:59 AM    [address], [place], [place]  [address] Phone: [phone]; Fax: [phone]

## 2022-03-25 NOTE — Telephone Encounter (Signed)
BP monitor never picked up after 1 year and restocked

## 2022-04-08 ENCOUNTER — Ambulatory Visit: Payer: Self-pay | Admitting: Physician Assistant

## 2022-04-09 ENCOUNTER — Encounter: Payer: Self-pay | Admitting: Physician Assistant

## 2022-10-20 IMAGING — DX DG HIP (WITH OR WITHOUT PELVIS) 2-3V*R*
3 series · 3 of 3 positions shown · non-contrast
Comparison: No priors.

CLINICAL DATA: 55-year-old female with history of trauma from a
fall. Right-sided hip pain.

EXAM:
DG HIP (WITH OR WITHOUT PELVIS) 2-3V RIGHT

[pelvis ap]
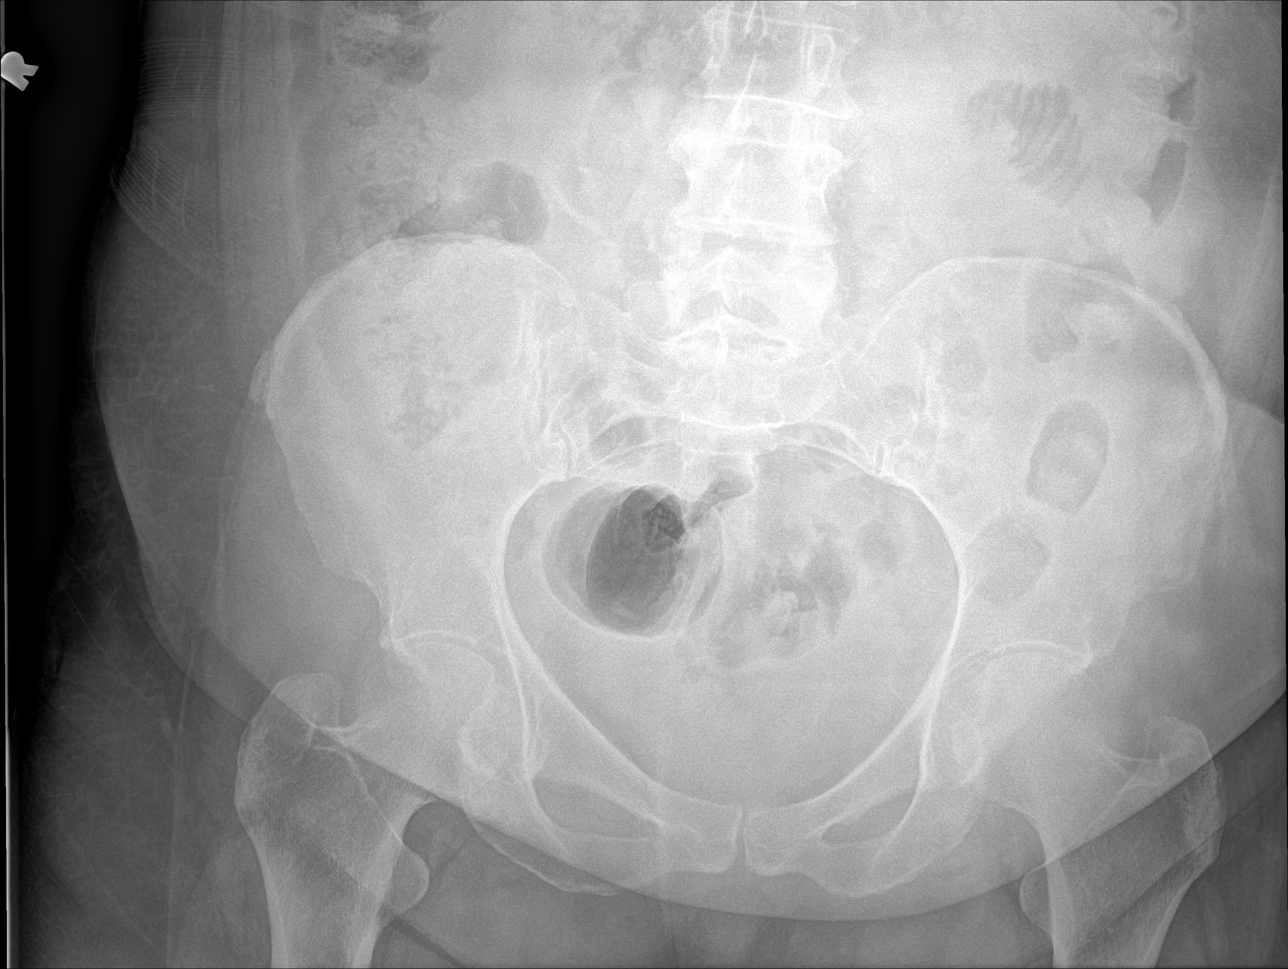

[hip ap]
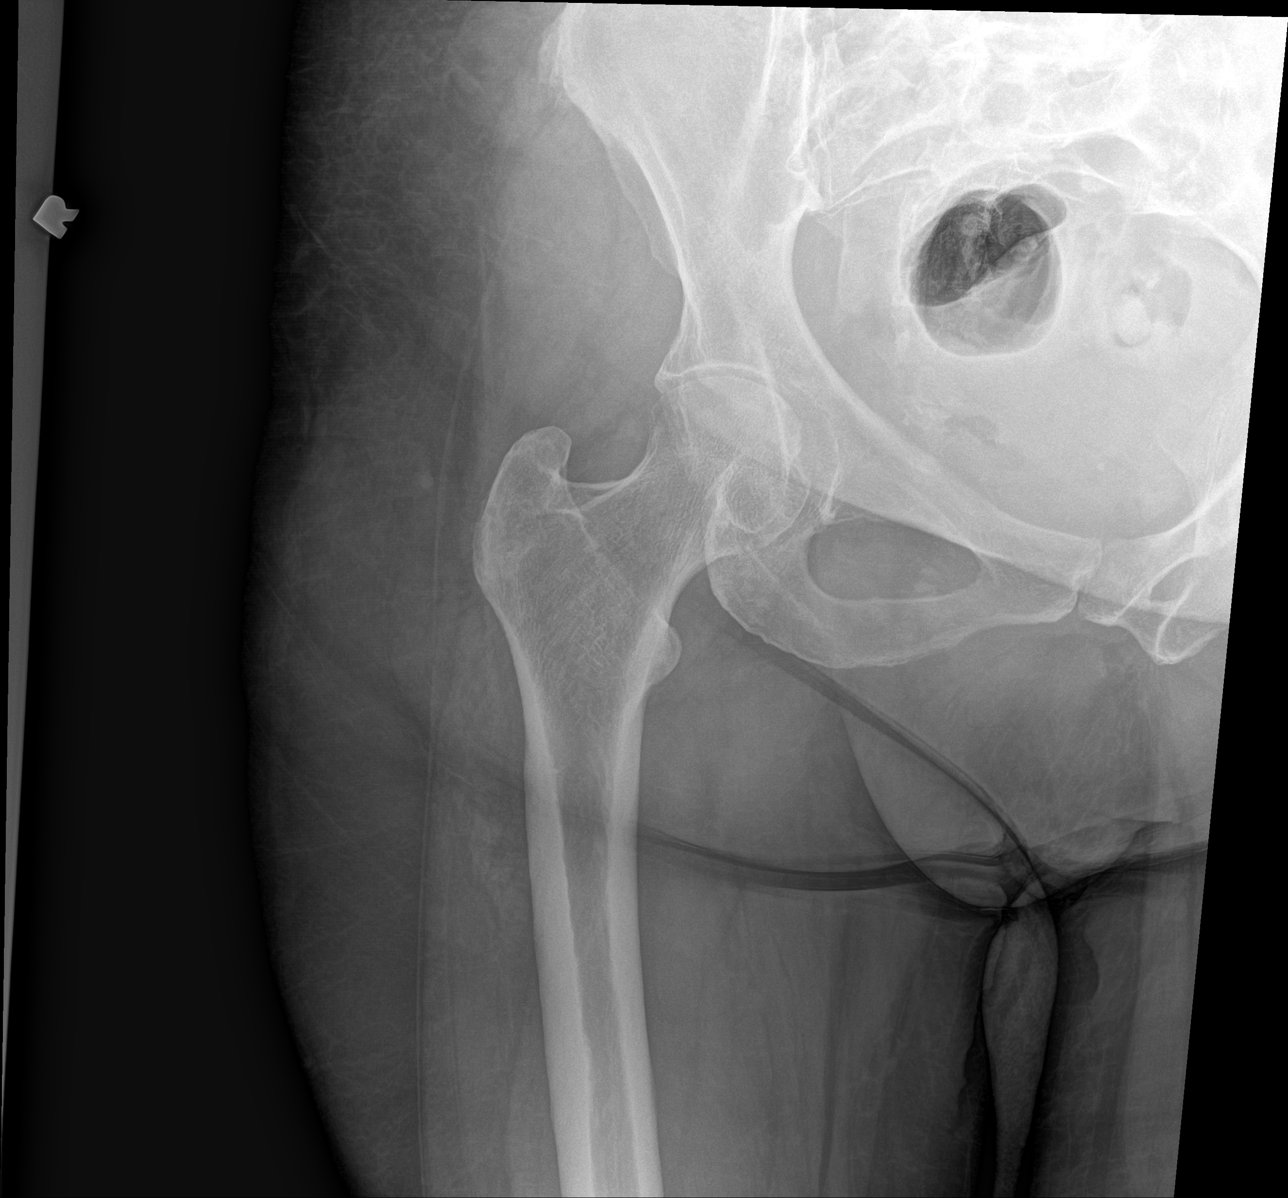

[hip lat]
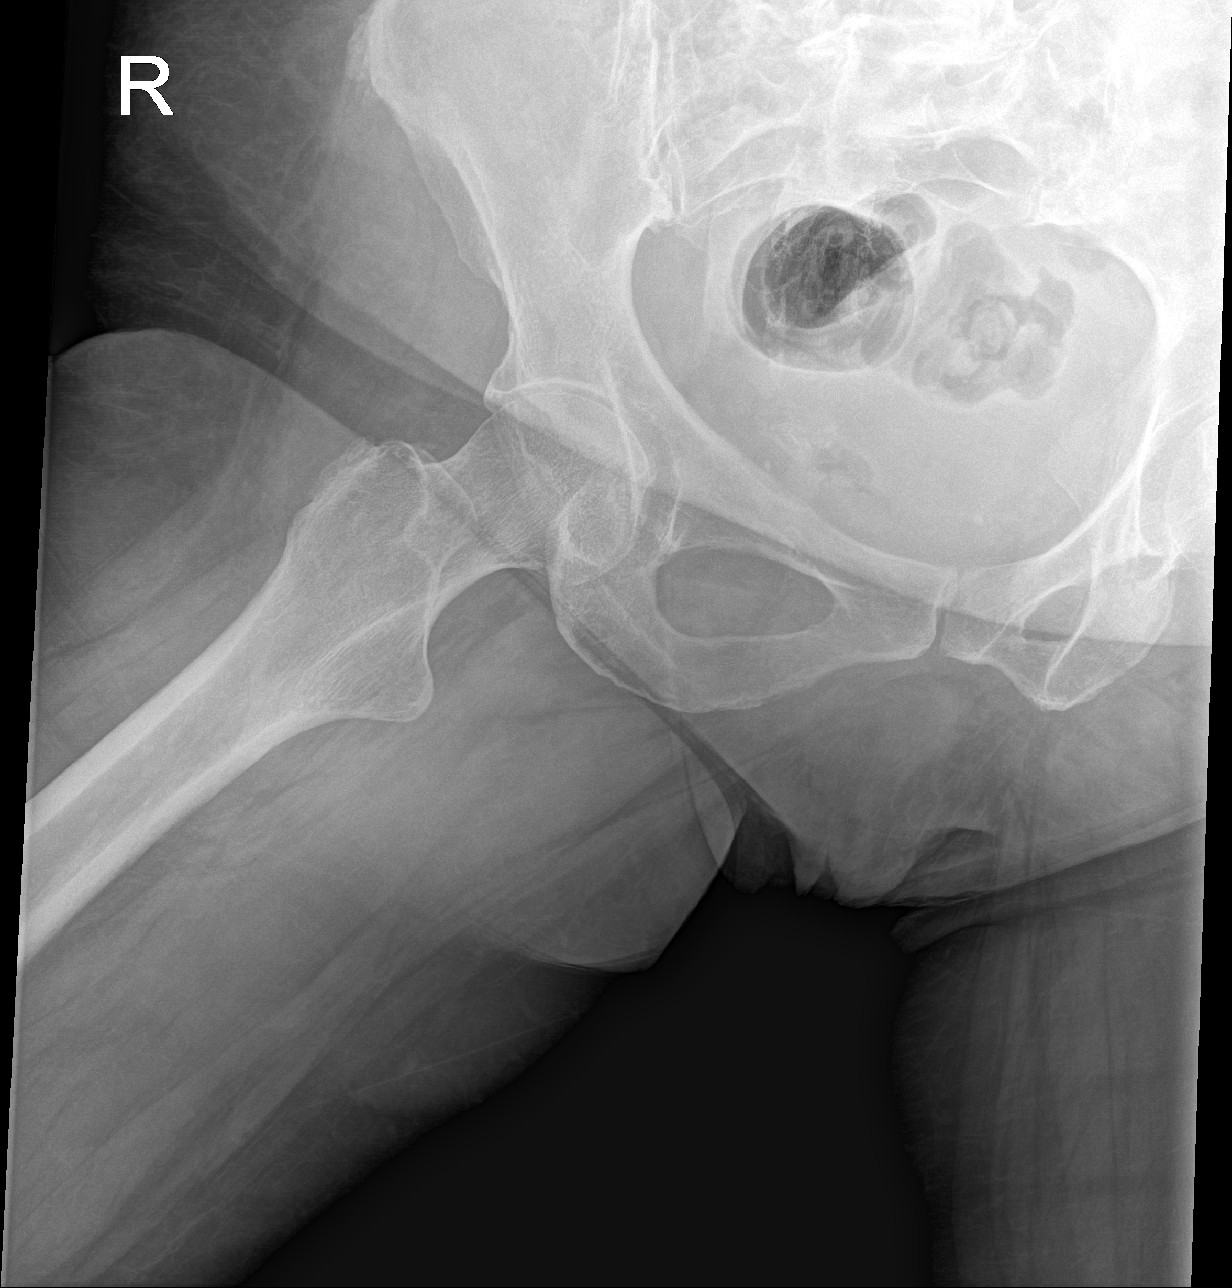

[3 of 3 positions shown; findings below may reference images not displayed]

FINDINGS: Three views of the bony pelvis demonstrate no acute displaced
fracture of the bony pelvic ring. Bilateral proximal femurs as
visualized appear intact. Right femoral head is located. There is
joint space narrowing, subchondral sclerosis, subchondral cyst
formation and osteophyte formation in the hip joints bilaterally,
indicative of moderate bilateral hip joint osteoarthritis.
IMPRESSION: 1. No acute radiographic abnormality of the bony pelvis or the right
hip.
2. Moderate bilateral hip joint osteoarthritis.

## 2022-10-29 ENCOUNTER — Emergency Department (HOSPITAL_COMMUNITY): Payer: Medicaid Other

## 2022-10-29 ENCOUNTER — Other Ambulatory Visit: Payer: Self-pay

## 2022-10-29 ENCOUNTER — Encounter (HOSPITAL_COMMUNITY)
Admission: EM | Disposition: A | Discharge status: Home / Self Care | Payer: Self-pay | Source: Home / Self Care | Attending: Emergency Medicine

## 2022-10-29 ENCOUNTER — Observation Stay (HOSPITAL_COMMUNITY)
Admission: EM | Admit: 2022-10-29 | Discharge: 2022-10-30 | Disposition: A | Discharge status: Home / Self Care | Payer: Medicaid Other | Attending: Cardiology | Admitting: Cardiology

## 2022-10-29 ENCOUNTER — Encounter (HOSPITAL_COMMUNITY): Payer: Self-pay | Admitting: *Deleted

## 2022-10-29 DIAGNOSIS — Z7984 Long term (current) use of oral hypoglycemic drugs: Secondary | ICD-10-CM | POA: Insufficient documentation

## 2022-10-29 DIAGNOSIS — R7989 Other specified abnormal findings of blood chemistry: Secondary | ICD-10-CM | POA: Insufficient documentation

## 2022-10-29 DIAGNOSIS — Z7902 Long term (current) use of antithrombotics/antiplatelets: Secondary | ICD-10-CM | POA: Diagnosis not present

## 2022-10-29 DIAGNOSIS — I25720 Atherosclerosis of autologous artery coronary artery bypass graft(s) with unstable angina pectoris: Secondary | ICD-10-CM | POA: Insufficient documentation

## 2022-10-29 DIAGNOSIS — I10 Essential (primary) hypertension: Secondary | ICD-10-CM | POA: Diagnosis not present

## 2022-10-29 DIAGNOSIS — E119 Type 2 diabetes mellitus without complications: Secondary | ICD-10-CM | POA: Diagnosis not present

## 2022-10-29 DIAGNOSIS — E1169 Type 2 diabetes mellitus with other specified complication: Secondary | ICD-10-CM

## 2022-10-29 DIAGNOSIS — Z8541 Personal history of malignant neoplasm of cervix uteri: Secondary | ICD-10-CM | POA: Insufficient documentation

## 2022-10-29 DIAGNOSIS — I200 Unstable angina: Secondary | ICD-10-CM

## 2022-10-29 DIAGNOSIS — I2510 Atherosclerotic heart disease of native coronary artery without angina pectoris: Secondary | ICD-10-CM

## 2022-10-29 DIAGNOSIS — F17210 Nicotine dependence, cigarettes, uncomplicated: Secondary | ICD-10-CM | POA: Diagnosis not present

## 2022-10-29 DIAGNOSIS — I214 Non-ST elevation (NSTEMI) myocardial infarction: Principal | ICD-10-CM

## 2022-10-29 DIAGNOSIS — R079 Chest pain, unspecified: Secondary | ICD-10-CM | POA: Diagnosis present

## 2022-10-29 DIAGNOSIS — Z79899 Other long term (current) drug therapy: Secondary | ICD-10-CM | POA: Diagnosis not present

## 2022-10-29 DIAGNOSIS — Z720 Tobacco use: Secondary | ICD-10-CM | POA: Diagnosis present

## 2022-10-29 HISTORY — PX: LEFT HEART CATH AND CORONARY ANGIOGRAPHY: CATH118249

## 2022-10-29 LAB — CBC WITH DIFFERENTIAL/PLATELET
Abs Immature Granulocytes: 0.05 10*3/uL (ref 0.00–0.07)
Basophils Absolute: 0.1 10*3/uL (ref 0.0–0.1)
Basophils Relative: 1 %
Eosinophils Absolute: 0.2 10*3/uL (ref 0.0–0.5)
Eosinophils Relative: 2 %
HCT: 47.5 % — ABNORMAL HIGH (ref 36.0–46.0)
Hemoglobin: 16 g/dL — ABNORMAL HIGH (ref 12.0–15.0)
Immature Granulocytes: 1 %
Lymphocytes Relative: 29 %
Lymphs Abs: 2.8 10*3/uL (ref 0.7–4.0)
MCH: 33.6 pg (ref 26.0–34.0)
MCHC: 33.7 g/dL (ref 30.0–36.0)
MCV: 99.8 fL (ref 80.0–100.0)
Monocytes Absolute: 0.5 10*3/uL (ref 0.1–1.0)
Monocytes Relative: 5 %
Neutro Abs: 5.9 10*3/uL (ref 1.7–7.7)
Neutrophils Relative %: 62 %
Platelets: 203 10*3/uL (ref 150–400)
RBC: 4.76 MIL/uL (ref 3.87–5.11)
RDW: 12.1 % (ref 11.5–15.5)
WBC: 9.4 10*3/uL (ref 4.0–10.5)
nRBC: 0 % (ref 0.0–0.2)

## 2022-10-29 LAB — COMPREHENSIVE METABOLIC PANEL
ALT: 43 U/L (ref 0–44)
AST: 26 U/L (ref 15–41)
Albumin: 4.2 g/dL (ref 3.5–5.0)
Alkaline Phosphatase: 103 U/L (ref 38–126)
Anion gap: 13 (ref 5–15)
BUN: 11 mg/dL (ref 6–20)
CO2: 21 mmol/L — ABNORMAL LOW (ref 22–32)
Calcium: 9.2 mg/dL (ref 8.9–10.3)
Chloride: 97 mmol/L — ABNORMAL LOW (ref 98–111)
Creatinine, Ser: 0.73 mg/dL (ref 0.44–1.00)
GFR, Estimated: >60 mL/min (ref >60)
Glucose, Bld: 382 mg/dL — ABNORMAL HIGH (ref 70–99)
Potassium: 4.3 mmol/L (ref 3.5–5.1)
Sodium: 131 mmol/L — ABNORMAL LOW (ref 135–145)
Total Bilirubin: 1 mg/dL (ref 0.3–1.2)
Total Protein: 7.7 g/dL (ref 6.5–8.1)

## 2022-10-29 LAB — LIPID PANEL
Cholesterol: 275 mg/dL — ABNORMAL HIGH (ref 0–200)
HDL: 62 mg/dL (ref >40)
LDL Cholesterol: 172 mg/dL — ABNORMAL HIGH (ref 0–99)
Total CHOL/HDL Ratio: 4.4 RATIO
Triglycerides: 204 mg/dL — ABNORMAL HIGH (ref <150)
VLDL: 41 mg/dL — ABNORMAL HIGH (ref 0–40)

## 2022-10-29 LAB — PROTIME-INR
INR: 0.9 (ref 0.8–1.2)
Prothrombin Time: 12.6 seconds (ref 11.4–15.2)

## 2022-10-29 LAB — GLUCOSE, CAPILLARY: Glucose-Capillary: 186 mg/dL — ABNORMAL HIGH (ref 70–99)

## 2022-10-29 LAB — TROPONIN I (HIGH SENSITIVITY)
Troponin I (High Sensitivity): 6 ng/L (ref <18)
Troponin I (High Sensitivity): 151 ng/L (ref <18)

## 2022-10-29 LAB — CBG MONITORING, ED: Glucose-Capillary: 325 mg/dL — ABNORMAL HIGH (ref 70–99)

## 2022-10-29 LAB — MAGNESIUM: Magnesium: 1.6 mg/dL — ABNORMAL LOW (ref 1.7–2.4)

## 2022-10-29 LAB — APTT: aPTT: 24 seconds (ref 24–36)

## 2022-10-29 SURGERY — LEFT HEART CATH AND CORONARY ANGIOGRAPHY
Anesthesia: LOCAL

## 2022-10-29 MED ORDER — HEPARIN SODIUM (PORCINE) 1000 UNIT/ML IJ SOLN
INTRAMUSCULAR | Status: DC | PRN
  Administered 2022-10-29: 4000 [IU] via INTRAVENOUS

## 2022-10-29 MED ORDER — SODIUM CHLORIDE 0.9% FLUSH: 3.0000 mL | INTRAVENOUS | Status: DC | PRN

## 2022-10-29 MED ORDER — HEPARIN (PORCINE) 25000 UT/250ML-% IV SOLN
850.0000 [IU]/h | INTRAVENOUS | Status: DC
  Administered 2022-10-29: 850 [IU]/h via INTRAVENOUS
  Filled 2022-10-29: qty 250

## 2022-10-29 MED ORDER — SODIUM CHLORIDE 0.9 % IV SOLN
INTRAVENOUS | Status: DC
  Administered 2022-10-29: 20 mL/h via INTRAVENOUS

## 2022-10-29 MED ORDER — VERAPAMIL HCL 2.5 MG/ML IV SOLN
INTRAVENOUS | Status: DC | PRN
  Administered 2022-10-29: 10 mL via INTRA_ARTERIAL

## 2022-10-29 MED ORDER — METOPROLOL TARTRATE 25 MG PO TABS
25.0000 mg | ORAL_TABLET | Freq: Two times a day (BID) | ORAL | Status: DC
  Administered 2022-10-29 – 2022-10-30 (×2): 25 mg via ORAL
  Filled 2022-10-29 (×2): qty 1

## 2022-10-29 MED ORDER — HEPARIN SODIUM (PORCINE) 1000 UNIT/ML IJ SOLN
INTRAMUSCULAR | Status: AC
  Filled 2022-10-29: qty 10

## 2022-10-29 MED ORDER — MIDAZOLAM HCL 2 MG/2ML IJ SOLN
INTRAMUSCULAR | Status: AC
  Filled 2022-10-29: qty 2

## 2022-10-29 MED ORDER — HEPARIN BOLUS VIA INFUSION
4000.0000 [IU] | Freq: Once | INTRAVENOUS | Status: AC
  Administered 2022-10-29: 4000 [IU] via INTRAVENOUS

## 2022-10-29 MED ORDER — IOHEXOL 350 MG/ML SOLN
INTRAVENOUS | Status: DC | PRN
  Administered 2022-10-29: 65 mL

## 2022-10-29 MED ORDER — ACETAMINOPHEN 325 MG PO TABS: 650.0000 mg | ORAL_TABLET | ORAL | Status: DC | PRN

## 2022-10-29 MED ORDER — MORPHINE SULFATE (PF) 4 MG/ML IV SOLN
4.0000 mg | Freq: Once | INTRAVENOUS | Status: AC
  Administered 2022-10-29: 4 mg via INTRAVENOUS
  Filled 2022-10-29: qty 1

## 2022-10-29 MED ORDER — NITROGLYCERIN 0.4 MG SL SUBL: 0.4000 mg | SUBLINGUAL_TABLET | SUBLINGUAL | Status: DC | PRN

## 2022-10-29 MED ORDER — ONDANSETRON HCL 4 MG/2ML IJ SOLN: 4.0000 mg | Freq: Four times a day (QID) | INTRAMUSCULAR | Status: DC | PRN

## 2022-10-29 MED ORDER — SODIUM CHLORIDE 0.9% FLUSH
3.0000 mL | Freq: Two times a day (BID) | INTRAVENOUS | Status: DC
  Administered 2022-10-29 – 2022-10-30 (×2): 3 mL via INTRAVENOUS

## 2022-10-29 MED ORDER — SODIUM CHLORIDE 0.9 % WEIGHT BASED INFUSION: 1.0000 mL/kg/h | INTRAVENOUS | Status: DC

## 2022-10-29 MED ORDER — NITROGLYCERIN 0.4 MG SL SUBL
0.4000 mg | SUBLINGUAL_TABLET | SUBLINGUAL | Status: DC | PRN
  Filled 2022-10-29: qty 1

## 2022-10-29 MED ORDER — FENTANYL CITRATE (PF) 100 MCG/2ML IJ SOLN
INTRAMUSCULAR | Status: DC | PRN
  Administered 2022-10-29 (×2): 25 ug via INTRAVENOUS

## 2022-10-29 MED ORDER — FENTANYL CITRATE (PF) 100 MCG/2ML IJ SOLN
INTRAMUSCULAR | Status: AC
  Filled 2022-10-29: qty 2

## 2022-10-29 MED ORDER — LIDOCAINE HCL (PF) 1 % IJ SOLN
INTRAMUSCULAR | Status: AC
  Filled 2022-10-29: qty 30

## 2022-10-29 MED ORDER — MIDAZOLAM HCL 2 MG/2ML IJ SOLN
INTRAMUSCULAR | Status: DC | PRN
  Administered 2022-10-29: 2 mg via INTRAVENOUS
  Administered 2022-10-29: 1 mg via INTRAVENOUS

## 2022-10-29 MED ORDER — LIDOCAINE HCL (PF) 1 % IJ SOLN
INTRAMUSCULAR | Status: DC | PRN
  Administered 2022-10-29: 2 mL via INTRADERMAL

## 2022-10-29 MED ORDER — SODIUM CHLORIDE 0.9 % WEIGHT BASED INFUSION: 3.0000 mL/kg/h | INTRAVENOUS | Status: DC

## 2022-10-29 MED ORDER — HYDRALAZINE HCL 20 MG/ML IJ SOLN: 10.0000 mg | INTRAMUSCULAR | Status: AC | PRN

## 2022-10-29 MED ORDER — HEPARIN (PORCINE) IN NACL 1000-0.9 UT/500ML-% IV SOLN
INTRAVENOUS | Status: DC | PRN
  Administered 2022-10-29 (×2): 500 mL

## 2022-10-29 MED ORDER — VERAPAMIL HCL 2.5 MG/ML IV SOLN
INTRAVENOUS | Status: AC
  Filled 2022-10-29: qty 2

## 2022-10-29 MED ORDER — CLOPIDOGREL BISULFATE 75 MG PO TABS
75.0000 mg | ORAL_TABLET | Freq: Every day | ORAL | Status: DC
  Administered 2022-10-29 – 2022-10-30 (×2): 75 mg via ORAL
  Filled 2022-10-29 (×2): qty 1

## 2022-10-29 MED ORDER — CHLORTHALIDONE 25 MG PO TABS
25.0000 mg | ORAL_TABLET | Freq: Every day | ORAL | Status: DC
  Administered 2022-10-30: 25 mg via ORAL
  Filled 2022-10-29: qty 1

## 2022-10-29 MED ORDER — LABETALOL HCL 5 MG/ML IV SOLN: 10.0000 mg | INTRAVENOUS | Status: DC | PRN

## 2022-10-29 MED ORDER — SODIUM CHLORIDE 0.9 % IV SOLN: 250.0000 mL | INTRAVENOUS | Status: DC | PRN

## 2022-10-29 MED ORDER — SODIUM CHLORIDE 0.9 % WEIGHT BASED INFUSION
3.0000 mL/kg/h | INTRAVENOUS | Status: DC
Start: 2022-10-30 — End: 2022-10-29

## 2022-10-29 MED ORDER — SODIUM CHLORIDE 0.9 % WEIGHT BASED INFUSION
1.0000 mL/kg/h | INTRAVENOUS | Status: DC
Start: 2022-10-30 — End: 2022-10-29

## 2022-10-29 MED ORDER — SODIUM CHLORIDE 0.9 % IV SOLN: INTRAVENOUS | Status: AC

## 2022-10-29 MED ORDER — LISINOPRIL 10 MG PO TABS
10.0000 mg | ORAL_TABLET | Freq: Every day | ORAL | Status: DC
  Administered 2022-10-30: 10 mg via ORAL
  Filled 2022-10-29: qty 1

## 2022-10-29 MED ORDER — SODIUM CHLORIDE 0.9% FLUSH: 3.0000 mL | Freq: Two times a day (BID) | INTRAVENOUS | Status: DC

## 2022-10-29 MED ORDER — LABETALOL HCL 5 MG/ML IV SOLN: 10.0000 mg | INTRAVENOUS | Status: AC | PRN

## 2022-10-29 MED ORDER — ATORVASTATIN CALCIUM 40 MG PO TABS
40.0000 mg | ORAL_TABLET | Freq: Every day | ORAL | Status: DC
  Administered 2022-10-29: 40 mg via ORAL
  Filled 2022-10-29: qty 1

## 2022-10-29 SURGICAL SUPPLY — 11 items
CATH 5FR JL3.5 JR4 ANG PIG MP (CATHETERS) IMPLANT
CATH EXPO 5F MPA-1 (CATHETERS) IMPLANT
DEVICE RAD COMP TR BAND LRG (VASCULAR PRODUCTS) IMPLANT
GLIDESHEATH SLEND SS 6F .021 (SHEATH) IMPLANT
GUIDEWIRE INQWIRE 1.5J.035X260 (WIRE) IMPLANT
INQWIRE 1.5J .035X260CM (WIRE) ×1
KIT HEART LEFT (KITS) ×1 IMPLANT
PACK CARDIAC CATHETERIZATION (CUSTOM PROCEDURE TRAY) ×1 IMPLANT
SHEATH PROBE COVER 6X72 (BAG) IMPLANT
TRANSDUCER W/STOPCOCK (MISCELLANEOUS) ×1 IMPLANT
TUBING CIL FLEX 10 FLL-RA (TUBING) ×1 IMPLANT

## 2022-10-29 NOTE — ED Notes (Signed)
[facility] at bedside for transport. 

## 2022-10-29 NOTE — Progress Notes (Signed)
Received patient from [facility] via [facility].  Pt alert and oriented X 4, skin warm and dry , resp even and unlabored, and denies any chest pain or discomfort at this time.  Consent signed, ,pt on bedside monitor, call bell in reach.  IV site in right [facility] with Heparin infusing.  NS started.

## 2022-10-29 NOTE — Progress Notes (Signed)
Assisted patient to walk to bathroom; voided; assisted back to stretcher. Tolerated activity well.

## 2022-10-29 NOTE — ED Provider Notes (Signed)
[place] EMERGENCY DEPARTMENT AT [facility] Provider Note   CSN: [id number] Arrival date & time: 10/29/22  4403     History  Chief Complaint  Patient presents with   Chest Pain    Laura Frost is a 57 y.o. female.  HPI Patient presents with acute onset left-sided chest pain.  Pain is left upper chest, shoulder.  No posterior pain, no dyspnea, onset was without clear precipitant, has been persistent since that time. 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     Home Medications Prior to Admission medications   Medication Sig Start Date End Date Taking? Authorizing Provider  albuterol (PROVENTIL HFA;VENTOLIN HFA) 108 (90 Base) MCG/ACT inhaler Inhale 1-2 puffs into the lungs every 6 (six) hours as needed for wheezing or shortness of breath.   Yes [provider]  atorvastatin (LIPITOR) 40 MG tablet Take 1 tablet (40 mg total) by mouth daily at 6 PM. 02/16/21  Yes Kroeger, Dot Lanes M., PA-C  chlorthalidone (HYGROTON) 25 MG tablet Take 25 mg by mouth daily.   Yes [provider]  clopidogrel (PLAVIX) 75 MG tablet Take 1 tablet (75 mg total) by mouth daily. 02/17/21  Yes Kroeger, Dot Lanes M., PA-C  empagliflozin (JARDIANCE) 10 MG TABS tablet Take 1 tablet (10 mg total) by mouth daily before breakfast. 03/15/21  Yes Netta Neat., NP  lisinopril (PRINIVIL,ZESTRIL) 10 MG tablet Take 10 mg by mouth daily.   Yes [provider]  metFORMIN (GLUCOPHAGE) 500 MG tablet Take 500 mg by mouth 2 (two) times daily with a meal.   Yes [provider]  metoprolol tartrate (LOPRESSOR) 25 MG tablet Take 25 mg by mouth 2 (two) times daily.   Yes [provider]  nitroGLYCERIN (NITROSTAT) 0.4 MG SL tablet Place 1 tablet (0.4 mg total) under the tongue every 5 (five) minutes x 3 doses as needed for chest pain. 02/16/21  Yes Kroeger, Ovidio Kin., PA-C      Allergies     Asa [aspirin]    Review of Systems   Review of Systems  All other systems reviewed and are negative.   Physical Exam Updated Vital Signs BP (!) 171/81   Pulse 70   Temp 98.2 F (36.8 C) (Oral)   Resp (!) 25   Ht 5\' 5"  (1.651 m)   Wt 74.8 kg   SpO2 100%   BMI 27.46 kg/m  Physical Exam Vitals and nursing note reviewed.  Constitutional:      General: She is not in acute distress.    Appearance: She is well-developed. She is ill-appearing and diaphoretic.  HENT:     Head: Normocephalic and atraumatic.  Eyes:     Conjunctiva/sclera: Conjunctivae normal.  Cardiovascular:     Rate and Rhythm: Normal rate and regular rhythm.  Pulmonary:     Effort: Pulmonary effort is normal. Tachypnea present. No respiratory distress.     Breath sounds: Normal breath sounds. No stridor.  Abdominal:     General: There is no distension.  Skin:    General: Skin is warm.  Neurological:     Mental Status: She is alert and oriented to person, place, and time.     Cranial Nerves: No cranial nerve deficit.  Psychiatric:        Mood and Affect: Mood normal.     ED Results / Procedures / Treatments   Labs (all labs ordered are  listed, but only abnormal results are displayed) Labs Reviewed  CBC WITH DIFFERENTIAL/PLATELET - Abnormal; Notable for the following components:      Result Value   Hemoglobin 16.0 (*)    HCT 47.5 (*)    All other components within normal limits  COMPREHENSIVE METABOLIC PANEL - Abnormal; Notable for the following components:   Sodium 131 (*)    Chloride 97 (*)    CO2 21 (*)    Glucose, Bld 382 (*)    All other components within normal limits  LIPID PANEL - Abnormal; Notable for the following components:   Cholesterol 275 (*)    Triglycerides 204 (*)    VLDL 41 (*)    LDL Cholesterol 172 (*)    All other components within normal limits  MAGNESIUM - Abnormal; Notable for the following components:   Magnesium 1.6 (*)    All other components within normal limits   CBG MONITORING, ED - Abnormal; Notable for the following components:   Glucose-Capillary 325 (*)    All other components within normal limits  TROPONIN I (HIGH SENSITIVITY) - Abnormal; Notable for the following components:   Troponin I (High Sensitivity) 151 (*)    All other components within normal limits  PROTIME-INR  APTT  HEPARIN LEVEL (UNFRACTIONATED)  TROPONIN I (HIGH SENSITIVITY)    EKG EKG Interpretation  Date/Time:  Tuesday October 29 2022 09:11:12 EDT Ventricular Rate:  82 PR Interval:  158 QRS Duration: 84 QT Interval:  370 QTC Calculation: 432 R Axis:   -52 Text Interpretation: Normal sinus rhythm Left anterior fascicular block Septal infarct , age undetermined Abnormal ECG Confirmed by Gerhard Munch [phone]) on 10/29/2022 9:29:15 AM  Radiology DG Chest Port 1 View  Result Date: 10/29/2022 CLINICAL DATA:  Chest pain. EXAM: PORTABLE CHEST 1 VIEW COMPARISON:  March 10, 2022. FINDINGS: The heart size and mediastinal contours are within normal limits. Both lungs are clear. The visualized skeletal structures are unremarkable. IMPRESSION: No active disease. Electronically Signed   By: Lupita Raider M.D.   On: 10/29/2022 10:22    Procedures Procedures    Medications Ordered in ED Medications  0.9 %  sodium chloride infusion (20 mL/hr Intravenous New Bag/Given 10/29/22 1005)  nitroGLYCERIN (NITROSTAT) SL tablet 0.4 mg (has no administration in time range)  labetalol (NORMODYNE) injection 10 mg (has no administration in time range)  heparin ADULT infusion 100 units/mL (25000 units/275mL) (850 Units/hr Intravenous New Bag/Given 10/29/22 1218)  clopidogrel (PLAVIX) tablet 75 mg (has no administration in time range)  morphine (PF) 4 MG/ML injection 4 mg (4 mg Intravenous Given 10/29/22 1002)  heparin bolus via infusion 4,000 Units (4,000 Units Intravenous Bolus from Bag 10/29/22 1220)    ED Course/ Medical Decision Making/ A&P                             Medical  Decision Making Adult female with prior MI, elevated risk profile presents with concern for chest pain.  Differential includes ACS, soft tissue injury, pneumonia, less likely PE with no hypoxia, minimal risk profile be on smoking. Patient had labs, received nitroglycerin, continuous monitoring, morphine. Cardiac 85 sinus normal Pulse ox 100% room air normal   Amount and/or Complexity of Data Reviewed External Data Reviewed: notes. Labs: ordered. Decision-making details documented in ED Course. Radiology: ordered and independent interpretation performed. Decision-making details documented in ED Course. ECG/medicine tests: ordered and independent interpretation performed. Decision-making details documented in ED Course.  Risk Prescription drug management. Decision regarding hospitalization. Diagnosis or treatment significantly limited by social determinants of health.   1:06 PM Pain has remained resolved.  I discussed patient's case with our cardiologist, Dr. Diona Browner. On reviewing patient's catheterization report from 2 years ago with stenosis, no intervention, and with today's troponin elevated to 150 after initial unremarkable value, patient will start heparin drip, will be admitted for further monitoring, management with concern for unstable angina.        Final Clinical Impression(s) / ED Diagnoses Final diagnoses:  Unstable angina (HCC)    CRITICAL CARE Performed by: Gerhard Munch Total critical care time: 35 minutes Critical care time was exclusive of separately billable procedures and treating other patients. Critical care was necessary to treat or prevent imminent or life-threatening deterioration. Critical care was time spent personally by me on the following activities: development of treatment plan with patient and/or surrogate as well as nursing, discussions with consultants, evaluation of patient's response to treatment, examination of patient, obtaining history  from patient or surrogate, ordering and performing treatments and interventions, ordering and review of laboratory studies, ordering and review of radiographic studies, pulse oximetry and re-evaluation of patient's condition.    Gerhard Munch, MD 10/29/22 [phone]

## 2022-10-29 NOTE — Interval H&P Note (Signed)
  Cath Lab Visit (complete for each Cath Lab visit)  Clinical Evaluation Leading to the Procedure:   ACS: Yes.    Non-ACS:    Anginal Classification: CCS IV  Anti-ischemic medical therapy: Minimal Therapy (1 class of medications)  Non-Invasive Test Results: No non-invasive testing performed  Prior CABG: No previous CABG      History and Physical Interval Note:  10/29/2022 4:32 PM  Laura Frost  has presented today for surgery, with the diagnosis of chest pain.  The various methods of treatment have been discussed with the patient and family. After consideration of risks, benefits and other options for treatment, the patient has consented to  Procedure(s): LEFT HEART CATH AND CORONARY ANGIOGRAPHY (N/A) as a surgical intervention.  The patient's history has been reviewed, patient examined, no change in status, stable for surgery.  I have reviewed the patient's chart and labs.  Questions were answered to the patient's satisfaction.     Lance Muss

## 2022-10-29 NOTE — Progress Notes (Signed)
ANTICOAGULATION CONSULT NOTE - Initial Consult  Pharmacy Consult for heparin Indication: chest pain/ACS  Allergies  Allergen Reactions   Asa [Aspirin]     Told as child she was allergic    Patient Measurements: Height: 5\' 5"  (165.1 cm) Weight: 74.8 kg (165 lb) IBW/kg (Calculated) : 57 Heparin Dosing Weight:   Vital Signs: Temp: 98.2 F (36.8 C) (06/18 1115) Temp Source: Oral (06/18 1115) BP: 168/73 (06/18 1115) Pulse Rate: 78 (06/18 1115)  Labs: Recent Labs    10/29/22 1003 10/29/22 1120  HGB 16.0*  --   HCT 47.5*  --   PLT 203  --   APTT 24  --   LABPROT 12.6  --   INR 0.9  --   CREATININE 0.73  --   TROPONINIHS 6 151*    Estimated Creatinine Clearance: 78.5 mL/min (by C-G formula based on SCr of 0.73 mg/dL).   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  Patient is not on anticoagulation prior to admission.  CBC WNL Trop 151  Goal of Therapy:  Heparin level 0.3-0.7 units/ml Monitor platelets by anticoagulation protocol: Yes   Plan:  Give 4000 units bolus x 1 Start heparin infusion at 850 units/hr Check anti-Xa level in 6 hours and daily while on heparin Continue to monitor H&H and platelets  Judeth Cornfield, PharmD Clinical Pharmacist 10/29/2022 12:07 PM

## 2022-10-29 NOTE — H&P (Signed)
Cardiology Admission History and Physical:   Patient ID: Laura Frost; [id number]; [birth year]   Admission date: 10/29/2022  Primary Care Provider: [facility] Primary Cardiologist: Nona Dell, MD (last seen 2022)  Chief Complaint: Chest and left shoulder discomfort  History of Present Illness:   Laura Frost is a 57 y.o. 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  She states symptoms are very similar to presentation in 2022.  At that time she underwent cardiac catheterization with high-sensitivity troponin I elevation in the 300 range and documentation of branch vessel disease (80% RPAV and 80% first RPL) managed medically.  Has reported aspirin allergy, unsure of actual details, was told this when she was a child and has not been on it.  She has been on Plavix since 2022.  I did review her outpatient medications, she states that she misses taking them regularly at least 2 days a week.  Thoracic and left shoulder symptoms have improved with nitroglycerin and morphine in the ER, she is currently on IV heparin and blood pressure is trending down from significant elevation at presentation.  ROS:  Pertinent review in the history of present illness.  No palpitations or syncope, no claudication.  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   Cervical cancer (HCC)    Hypertension    Type 2 diabetes mellitus (HCC)     Past Surgical History:  Procedure Laterality Date   LEFT HEART CATH AND CORONARY ANGIOGRAPHY N/A 02/15/2021   Procedure: LEFT HEART CATH AND CORONARY ANGIOGRAPHY;  Surgeon: Runell Gess, MD;  Location: [facility] INVASIVE CV LAB;  Service: Cardiovascular;  Laterality: N/A;   OVARY SURGERY       Social History:    Social History   Tobacco Use   Smoking status: Every Day    Packs/day: .5    Types: Cigarettes   Smokeless tobacco: Never  Substance Use Topics   Alcohol use: Yes    Comment: Occasional    Family History: The patient's family history includes Heart attack in her father.    Medications Prior to Admission: Prior to Admission medications   Medication Sig Start Date End Date Taking? Authorizing Provider  albuterol (PROVENTIL HFA;VENTOLIN HFA) 108 (90 Base) MCG/ACT inhaler Inhale 1-2 puffs into the lungs every 6 (six) hours as needed for wheezing or shortness of breath.   Yes [provider]  atorvastatin (LIPITOR) 40 MG tablet Take 1 tablet (40 mg total) by mouth daily at 6 PM. 02/16/21  Yes Kroeger, Dot Lanes M., PA-C  chlorthalidone (HYGROTON) 25 MG tablet Take 25 mg by mouth daily.   Yes [provider]  clopidogrel (PLAVIX) 75 MG tablet Take 1 tablet (75 mg total) by mouth daily. 02/17/21  Yes Kroeger, Dot Lanes M., PA-C  empagliflozin (JARDIANCE) 10 MG TABS tablet Take 1 tablet (10 mg total) by mouth daily before breakfast. 03/15/21  Yes Netta Neat., NP  lisinopril (PRINIVIL,ZESTRIL) 10 MG tablet Take 10 mg by mouth daily.   Yes [provider]  metFORMIN (GLUCOPHAGE) 500 MG tablet Take 500 mg by mouth 2 (two) times daily with a meal.   Yes [provider]  metoprolol tartrate (LOPRESSOR) 25 MG tablet Take 25 mg by mouth 2 (two) times daily.   Yes  [provider]  nitroGLYCERIN (NITROSTAT) 0.4 MG SL tablet Place 1 tablet (0.4 mg total) under the tongue every 5 (five) minutes x 3 doses as needed for chest pain. 02/16/21  Yes Kroeger, Ovidio Kin., PA-C     Allergies:    Allergies  Allergen Reactions   Asa [Aspirin]     Told as child she was allergic    Physical Exam/Data:   Vitals:   10/29/22 1221 10/29/22 1230 10/29/22 1245 10/29/22 1300  BP:  (!) 142/80 (!) 171/81 (!) 146/78  Pulse:  70 70 66  Resp: 18 (!) 25    Temp:       TempSrc:      SpO2:  100% 100% 100%  Weight:      Height:       No intake or output data in the 24 hours ending 10/29/22 1320 Filed Weights   10/29/22 0910  Weight: 74.8 kg   Body mass index is 27.46 kg/m.   Gen: Patient appears comfortable at rest. HEENT: Conjunctiva and lids normal, oropharynx clear. Neck: Supple, no elevated JVP or carotid bruits. Lungs: Clear to auscultation, nonlabored breathing at rest. Cardiac: Regular rate and rhythm, no S3 or significant systolic murmur, no pericardial rub. Abdomen: Soft, nontender, bowel sounds present, no guarding or rebound. Extremities: No pitting edema, distal pulses 2+. Skin: Warm and dry. Musculoskeletal: No kyphosis. Neuropsychiatric: Alert and oriented x3, affect grossly appropriate.   EKG:  An ECG dated 10/29/2022 was personally reviewed today and demonstrated:  Sinus rhythm with left anterior fascicular block and decreased R wave progression.  Relevant CV Studies:  Cardiac catheterization 02/15/2021:   RPAV lesion is 80% stenosed.   1st RPL lesion is 80% stenosed.   The left ventricular systolic function is normal.   The left ventricular ejection fraction is 55-65% by visual estimate.  Echocardiogram 03/11/2022:  1. Left ventricular ejection fraction, by estimation, is 60 to 65%. The  left ventricle has normal function. The left ventricle has no regional  wall motion abnormalities. There is mild concentric left ventricular  hypertrophy. Left ventricular diastolic  parameters are consistent with Grade II diastolic dysfunction  (pseudonormalization).   2. Right ventricular systolic function is normal. The right ventricular  size is normal. There is normal pulmonary artery systolic pressure. The  estimated right ventricular systolic pressure is 21.8 mmHg.   3. The mitral valve is abnormal with mildly restricted posterior leaflet.  Moderate mitral valve regurgitation, posteriorly directed.   4. The aortic valve is  tricuspid. There is mild calcification of the  aortic valve. Aortic valve regurgitation is trivial. Aortic valve mean  gradient measures 5.0 mmHg.   5. The inferior vena cava is normal in size with greater than 50%  respiratory variability, suggesting right atrial pressure of 3 mmHg.   Laboratory Data:  Chemistry Recent Labs  Lab 10/29/22 1003  NA 131*  K 4.3  CL 97*  CO2 21*  GLUCOSE 382*  BUN 11  CREATININE 0.73  CALCIUM 9.2  GFRNONAA >60  ANIONGAP 13    Recent Labs  Lab 10/29/22 1003  PROT 7.7  ALBUMIN 4.2  AST 26  ALT 43  ALKPHOS 103  BILITOT 1.0   Hematology Recent Labs  Lab 10/29/22 1003  WBC 9.4  RBC 4.76  HGB 16.0*  HCT 47.5*  MCV 99.8  MCH 33.6  MCHC 33.7  RDW 12.1  PLT 203   Cardiac Enzymes Recent Labs  Lab 10/29/22 1003 10/29/22 1120  TROPONINIHS 6 151*  Lipid Panel     Component Value Date/Time   CHOL 275 (H) 10/29/2022 1003   TRIG 204 (H) 10/29/2022 1003   HDL 62 10/29/2022 1003   CHOLHDL 4.4 10/29/2022 1003   VLDL 41 (H) 10/29/2022 1003   LDLCALC 172 (H) 10/29/2022 1003    Radiology/Studies:  DG Chest Port 1 View  Result Date: 10/29/2022 CLINICAL DATA:  Chest pain. EXAM: PORTABLE CHEST 1 VIEW COMPARISON:  March 10, 2022. FINDINGS: The heart size and mediastinal contours are within normal limits. Both lungs are clear. The visualized skeletal structures are unremarkable. IMPRESSION: No active disease. Electronically Signed   By: Lupita Raider M.D.   On: 10/29/2022 10:22    Assessment and Plan:   1.  Unstable angina, evolving NSTEMI, high-sensitivity troponin I levels up from normal to 151, ECG nonspecific with left anterior fascicular block and decreased R wave progression.  She has a known history of branch vessel disease as of 2022 that was managed medically.  Has an aspirin allergy (details not clear) and has been on Plavix as an outpatient.  Not completely compliant with medical therapy however.  Also presenting hypertensive  and hyperglycemic, LDL 172.  2.  Essential hypertension, outpatient regimen including Lopressor, chlorthalidone, and lisinopril.  3.  Type 2 diabetes mellitus, on Jardiance and metformin as an outpatient.  Presents hyperglycemic.  4.  Branch vessel CAD documented in October 2022 at that point with 80% RPAV and 80% first RPL managed medically, LVEF normal.  Discussed situation with patient including the risks and benefits of a diagnostic cardiac catheterization and she is in agreement to proceed.  States that she has not eaten today and we will try to get this arranged at [facility] this afternoon following transfer.  Continue IV heparin, give regular Plavix dose today.  Increase Lipitor to 80 mg daily.  Check hemoglobin A1c, hold metformin and continue Jardiance.  Resume antihypertensive regimen and adjust as necessary.  Signed, Nona Dell, MD  10/29/2022 1:20 PM

## 2022-10-29 NOTE — ED Triage Notes (Signed)
Pt c/o left side chest pain that started 30 mins pta; pt states she had one episode of vomiting; pt states the pain is non-radiating

## 2022-10-30 ENCOUNTER — Encounter (HOSPITAL_COMMUNITY): Payer: Self-pay | Admitting: Interventional Cardiology

## 2022-10-30 ENCOUNTER — Other Ambulatory Visit (HOSPITAL_COMMUNITY): Payer: Self-pay

## 2022-10-30 DIAGNOSIS — I214 Non-ST elevation (NSTEMI) myocardial infarction: Secondary | ICD-10-CM

## 2022-10-30 LAB — GLUCOSE, CAPILLARY: Glucose-Capillary: 259 mg/dL — ABNORMAL HIGH (ref 70–99)

## 2022-10-30 LAB — CBC
HCT: 41.9 % (ref 36.0–46.0)
Hemoglobin: 14.1 g/dL (ref 12.0–15.0)
MCH: 33.2 pg (ref 26.0–34.0)
MCHC: 33.7 g/dL (ref 30.0–36.0)
MCV: 98.6 fL (ref 80.0–100.0)
Platelets: 175 10*3/uL (ref 150–400)
RBC: 4.25 MIL/uL (ref 3.87–5.11)
RDW: 11.9 % (ref 11.5–15.5)
WBC: 6.1 10*3/uL (ref 4.0–10.5)
nRBC: 0 % (ref 0.0–0.2)

## 2022-10-30 LAB — LIPID PANEL
Cholesterol: 242 mg/dL — ABNORMAL HIGH (ref 0–200)
HDL: 44 mg/dL (ref >40)
LDL Cholesterol: UNDETERMINED mg/dL (ref 0–99)
Total CHOL/HDL Ratio: 5.5 RATIO
Triglycerides: 498 mg/dL — ABNORMAL HIGH (ref <150)
VLDL: UNDETERMINED mg/dL (ref 0–40)

## 2022-10-30 LAB — BASIC METABOLIC PANEL
Anion gap: 7 (ref 5–15)
BUN: 8 mg/dL (ref 6–20)
CO2: 26 mmol/L (ref 22–32)
Calcium: 9 mg/dL (ref 8.9–10.3)
Chloride: 99 mmol/L (ref 98–111)
Creatinine, Ser: 0.65 mg/dL (ref 0.44–1.00)
GFR, Estimated: >60 mL/min (ref >60)
Glucose, Bld: 291 mg/dL — ABNORMAL HIGH (ref 70–99)
Potassium: 3.9 mmol/L (ref 3.5–5.1)
Sodium: 132 mmol/L — ABNORMAL LOW (ref 135–145)

## 2022-10-30 LAB — HEMOGLOBIN A1C
Hgb A1c MFr Bld: 9.3 % — ABNORMAL HIGH (ref 4.8–5.6)
Mean Plasma Glucose: 220.21 mg/dL

## 2022-10-30 LAB — LDL CHOLESTEROL, DIRECT: Direct LDL: 128 mg/dL — ABNORMAL HIGH (ref 0–99)

## 2022-10-30 MED ORDER — MAGNESIUM SULFATE 4 GM/100ML IV SOLN
4.0000 g | Freq: Once | INTRAVENOUS | Status: AC
  Administered 2022-10-30: 4 g via INTRAVENOUS
  Filled 2022-10-30: qty 100

## 2022-10-30 MED ORDER — INSULIN ASPART 100 UNIT/ML IJ SOLN
0.0000 [IU] | Freq: Three times a day (TID) | INTRAMUSCULAR | Status: DC
  Administered 2022-10-30: 8 [IU] via SUBCUTANEOUS

## 2022-10-30 MED ORDER — AMLODIPINE BESYLATE 5 MG PO TABS
5.0000 mg | ORAL_TABLET | Freq: Every day | ORAL | 2 refills | Status: DC
  Filled 2022-10-30: qty 30, 30d supply, fill #0

## 2022-10-30 MED ORDER — AMLODIPINE BESYLATE 5 MG PO TABS
5.0000 mg | ORAL_TABLET | Freq: Every day | ORAL | Status: DC
  Administered 2022-10-30: 5 mg via ORAL
  Filled 2022-10-30: qty 1

## 2022-10-30 MED ORDER — ATORVASTATIN CALCIUM 80 MG PO TABS: 80.0000 mg | ORAL_TABLET | Freq: Every day | ORAL | Status: DC

## 2022-10-30 MED ORDER — FLUCONAZOLE 150 MG PO TABS
150.0000 mg | ORAL_TABLET | Freq: Once | ORAL | Status: AC
  Administered 2022-10-30: 150 mg via ORAL
  Filled 2022-10-30: qty 1

## 2022-10-30 MED ORDER — ATORVASTATIN CALCIUM 80 MG PO TABS
80.0000 mg | ORAL_TABLET | Freq: Every day | ORAL | 2 refills | Status: DC
  Filled 2022-10-30: qty 30, 30d supply, fill #0

## 2022-10-30 NOTE — Progress Notes (Signed)
CARDIAC REHAB PHASE I   PRE:  Rate/Rhythm: 67 SR     BP: sitting 149/85    SpO2:   MODE:  Ambulation: 400 ft   POST:  Rate/Rhythm: 76 SR    BP: sitting 164/88     SpO2:   Tolerated well, no c/o. BP elevated. Discussed with pt MI, restrictions, smoking cessation, diet, exercise, NTG, and CRPII. Pt receptive. She is thinking about quitting smoking. Emphasized the importance of meds. Will refer to [facility] CRPII however she does not have insurance.  [id number]   Ethelda Chick BS, ACSM-CEP 10/30/2022 10:24 AM

## 2022-10-30 NOTE — Progress Notes (Signed)
Pt's CBG 259 this a.m; attempted to notify Su Hilt, NP via text page. Awaiting further orders.  Bari Edward, RN

## 2022-10-30 NOTE — TOC CM/SW Note (Addendum)
Transition of Care [facility]) - Inpatient Brief Assessment   Patient Details  Name: Laura Frost MRN: [id number] Date of Birth: 18-May-1965  [facility] CM/SW Contact:    Gala Lewandowsky, RN Phone Number: 10/30/2022, 11:16 AM   Clinical Narrative: Patient presented for chest pain-post cath. Patient is without insurance- spoke with patient regarding Medicaid and she will need to go to Social Services to see if she is a candidate. [facility]- appointment scheduled and placed on the AVS. The [facility] will assist with medication assistance if needed. Case Manager provided patient with [facility] information for transportation to appointments. CSW to provide information for SDOH food resources. Medications will need to be sent to [facility]. No further needs identified at this time.    Transition of Care Asessment: Insurance and Status: Insurance coverage has been reviewed (No insurance) Patient has primary care physician: Yes ([facility]- Appointment Scheduled.) Home environment has been reviewed: reviewed Prior level of function:: independent Prior/Current Home Services: No current home services Social Determinants of Health Reivew: SDOH reviewed needs interventions Readmission risk has been reviewed: Yes Transition of care needs: no transition of care needs at this time

## 2022-10-30 NOTE — Discharge Summary (Signed)
Discharge Summary    Patient ID: Laura Frost MRN: [id number]; DOB: 11-11-1965  Admit date: 10/29/2022 Discharge date: 10/30/2022  PCP:  Health, [facility]   [facility] Providers Cardiologist:  Nona Dell, MD      Discharge Diagnoses    Principal Problem:   NSTEMI (non-ST elevated myocardial infarction) [facility]) Active Problems:   Hypertension   Coronary artery disease   DM type 2 (diabetes mellitus, type 2) (HCC)   Tobacco abuse  Diagnostic Studies/Procedures    Cath: 10/29/2022    RPAV lesion is 80% stenosed.  Small vessel.   2nd Diag lesion is 70% stenosed.  Small vessel.   RPDA lesion is 70% stenosed.   The left ventricular systolic function is normal.   LV end diastolic pressure is normal.   The left ventricular ejection fraction is 55-65% by visual estimate.   There is no aortic valve stenosis.   In the absence of any other complications or medical issues, we expect the patient to be ready for discharge from a cath perspective on 10/30/2022.   Small vessel disease noted, similar to prior catheterization in 2022.  Mild progression of the small vessel diagonal disease.  No target for PCI.    Continue medical therapy.  She needs blood pressure control.   Diagnostic Dominance: Right  _____________   History of Present Illness     Laura Frost is a 57 y.o. female with PMH of non-obstructive CAD, tobacco use, DM, HTN who presented to [facility] with complaints of chest pain. Underwent cardiac cath 02/2021 in the setting of elevated troponin which showed 80% RPDAV and RPL stenosis which was treated medically. She reported an ASA allergy as well. Has been on plavix since 2022. Presented to [facility] with complaints of left shoulder pain that improved with SL nitroglycerin and morphine while in the ED. Her troponins were note to be elevated at 151. Did report she misses freq doses of her BP meds during the week. She was seen by Dr. Diona Browner and transferred  to the ED for further management.   Hospital Course    Chest pain Elevated troponin -- underwent cardiac cath noted above with non-obstructive CAD, 80% RPAV, 2nd diag 70% and RPDA 70% which were all small vessels. No target lesion for PCI. Recommendations for medical management -- continue plavix ( reports ASA allery), statin, BB, lisinopril   HTN -- blood pressures were elevated on admission, she reports missing freq doses of meds -- encouraged compliance -- continue metoprolol 25mg  BID, lisinopril 10mg  daily, stop chlorthalidone, start amlodipine 5mg  daily  DM -- Hgb A1c 9.3, poorly controlled -- reports having been on metformin and Jardiance -- treated with SSI while inpatient -- continue metformin at DC, hold jardiance with concern for yeast infection -- advised to keep follow up with PCP/health dept regarding ongoing management  Hypomagnesemia -- Mag 1.6, supplemented prior to DC -- as above, chlorthalidone stopped  Vaginal dryness/itching -- denies any discharge, noted symptoms for about a week prior  -- treated with diflucan 150mg  x1, advised to use OTC typical treatment as well -- follow up with PCP/health dept if no improvement  _____________  Discharge Vitals Blood pressure (!) 164/88, pulse 64, temperature 98.2 F (36.8 C), temperature source Oral, resp. rate 17, height 5\' 5"  (1.651 m), weight 77.9 kg, SpO2 94 %.  Filed Weights   10/29/22 0910 10/29/22 1845 10/30/22 0621  Weight: 74.8 kg 78.4 kg 77.9 kg    Labs & Radiologic Studies  CBC Recent Labs    10/29/22 1003 10/30/22 0033  WBC 9.4 6.1  NEUTROABS 5.9  --   HGB 16.0* 14.1  HCT 47.5* 41.9  MCV 99.8 98.6  PLT 203 175   Basic Metabolic Panel Recent Labs    02/72/53 1003 10/30/22 0033  NA 131* 132*  K 4.3 3.9  CL 97* 99  CO2 21* 26  GLUCOSE 382* 291*  BUN 11 8  CREATININE 0.73 0.65  CALCIUM 9.2 9.0  MG 1.6*  --    Liver Function Tests Recent Labs    10/29/22 1003  AST 26  ALT 43   ALKPHOS 103  BILITOT 1.0  PROT 7.7  ALBUMIN 4.2   No results for input(s): "LIPASE", "AMYLASE" in the last 72 hours. High Sensitivity Troponin:   Recent Labs  Lab 10/29/22 1003 10/29/22 1120  TROPONINIHS 6 151*    BNP Invalid input(s): "POCBNP" D-Dimer No results for input(s): "DDIMER" in the last 72 hours. Hemoglobin A1C Recent Labs    10/30/22 0033  HGBA1C 9.3*   Fasting Lipid Panel Recent Labs    10/30/22 0033  CHOL 242*  HDL 44  LDLCALC UNABLE TO CALCULATE IF TRIGLYCERIDE OVER 400 mg/dL  TRIG 664*  CHOLHDL 5.5  LDLDIRECT 128*   Thyroid Function Tests No results for input(s): "TSH", "T4TOTAL", "T3FREE", "THYROIDAB" in the last 72 hours.  Invalid input(s): "FREET3" _____________  CARDIAC CATHETERIZATION  Addendum Date: 10/29/2022     RPAV lesion is 80% stenosed.  Small vessel.   2nd Diag lesion is 70% stenosed.  Small vessel.   RPDA lesion is 70% stenosed.   The left ventricular systolic function is normal.   LV end diastolic pressure is normal.   The left ventricular ejection fraction is 55-65% by visual estimate.   There is no aortic valve stenosis.   In the absence of any other complications or medical issues, we expect the patient to be ready for discharge from a cath perspective on 10/30/2022. Small vessel disease noted, similar to prior catheterization in 2022.  Mild progression of the small vessel diagonal disease.  No target for PCI. Continue medical therapy.  She needs blood pressure control.  Result Date: 10/29/2022   RPAV lesion is 80% stenosed.  Small vessel.   2nd Diag lesion is 70% stenosed.  Small vessel.   RPDA lesion is 70% stenosed.   The left ventricular systolic function is normal.   LV end diastolic pressure is normal.   The left ventricular ejection fraction is 55-65% by visual estimate.   There is no aortic valve stenosis.   In the absence of any other complications or medical issues, we expect the patient to be ready for discharge from a cath  perspective on 10/30/2022. Small vessel disease noted, similar to prior catheterization in 2022.  Mild progression of the small vessel diagonal disease.  No target for PCI. Continue medical therapy.  She needs blood pressure control.   DG Chest Port 1 View  Result Date: 10/29/2022 CLINICAL DATA:  Chest pain. EXAM: PORTABLE CHEST 1 VIEW COMPARISON:  March 10, 2022. FINDINGS: The heart size and mediastinal contours are within normal limits. Both lungs are clear. The visualized skeletal structures are unremarkable. IMPRESSION: No active disease. Electronically Signed   By: Lupita Raider M.D.   On: 10/29/2022 10:22   Disposition   Pt is being discharged home today in good condition.  Follow-up Plans & Appointments     Follow-up Information     Aging, Disability &  Transit Services Follow up.   Why: Please call this service to see if they can transport to appointments. Contact information: Address: [address], [place], [place] [address] Phone: [phone]        Health, [facility] Follow up on 11/01/2022.   Why: @ 10:30 am with Ginger Carne- please bring d/c instructions, proof of income, and ID. If you need to cancel the appointment please call the office to reschedule. On this appointment you will see a representative for medication assistance. Please go next door to Social Services for assistance with Medicaid. Contact information: [address] [place] [address] [phone]         Sharlene Dory, NP Follow up on 11/13/2022.   Specialty: Cardiology Why: at 2:30pm for your follow up appt Contact information: [address] [place] [address] [phone]                Discharge Instructions     AMB Referral to Advanced Lipid Disorders Clinic   Complete by: As directed    Internal Lipid Clinic Referral Scheduling  Internal lipid clinic referrals are providers within [facility], who wish to refer established patients for routine  management (help in starting PCSK9 inhibitor therapy) or advanced therapies.  Internal MD referral criteria:              1. All patients with LDL>190 mg/dL  2. All patients with Triglycerides >500 mg/dL  3. Patients with suspected or confirmed heterozygous familial hyperlipidemia (HeFH) or homozygous familial hyperlipidemia (HoFH)  4. Patients with family history of suspicious for genetic dyslipidemia desiring genetic testing  5. Patients refractory to standard guideline based therapy  6. Patients with statin intolerance (failed 2 statins, one of which must be a high potency statin)  7. Patients who the provider desires to be seen by MD   Internal PharmD referral criteria:   1. Follow-up patients for medication management  2. Follow-up for compliance monitoring  3. Patients for drug education  4. Patients with statin intolerance  5. PCSK9 inhibitor education and prior authorization approvals  6. Patients with triglycerides <500 mg/dL  External Lipid Clinic Referral  External lipid clinic referrals are for providers outside of [facility], considered new clinic patients - automatically routed to MD schedule   Amb Referral to Cardiac Rehabilitation   Complete by: As directed    Diagnosis: NSTEMI   After initial evaluation and assessments completed: Virtual Based Care may be provided alone or in conjunction with Phase 2 Cardiac Rehab based on patient barriers.: Yes   Intensive Cardiac Rehabilitation (ICR) [facility] location only OR Traditional Cardiac Rehabilitation (TCR) *If criteria for ICR are not met will enroll in TCR [facility] only): Yes   Call MD for:  difficulty breathing, headache or visual disturbances   Complete by: As directed    Call MD for:  persistant dizziness or light-headedness   Complete by: As directed    Call MD for:  redness, tenderness, or signs of infection (pain, swelling, redness, odor or green/yellow discharge around incision site)   Complete by: As directed     Diet - low sodium heart healthy   Complete by: As directed    Discharge instructions   Complete by: As directed    Radial Site Care Refer to this sheet in the next few weeks. These instructions provide you with information on caring for yourself after your procedure. Your caregiver may also give you more specific instructions. Your treatment has been  planned according to current medical practices, but problems sometimes occur. Call your caregiver if you have any problems or questions after your procedure. HOME CARE INSTRUCTIONS You may shower the day after the procedure. Remove the bandage (dressing) and gently wash the site with plain soap and water. Gently pat the site dry.  Do not apply powder or lotion to the site.  Do not submerge the affected site in water for 3 to 5 days.  Inspect the site at least twice daily.  Do not flex or bend the affected arm for 24 hours.  No lifting over 5 pounds (2.3 kg) for 5 days after your procedure.  Do not drive home if you are discharged the same day of the procedure. Have someone else drive you.  You may drive 24 hours after the procedure unless otherwise instructed by your caregiver.  What to expect: Any bruising will usually fade within 1 to 2 weeks.  Blood that collects in the tissue (hematoma) may be painful to the touch. It should usually decrease in size and tenderness within 1 to 2 weeks.  SEEK IMMEDIATE MEDICAL CARE IF: You have unusual pain at the radial site.  You have redness, warmth, swelling, or pain at the radial site.  You have drainage (other than a small amount of blood on the dressing).  You have chills.  You have a fever or persistent symptoms for more than 72 hours.  You have a fever and your symptoms suddenly get worse.  Your arm becomes pale, cool, tingly, or numb.  You have heavy bleeding from the site. Hold pressure on the site.   Increase activity slowly   Complete by: As directed         Discharge Medications    Allergies as of 10/30/2022       Reactions   Asa [aspirin]    Told as child she was allergic        Medication List     STOP taking these medications    chlorthalidone 25 MG tablet Commonly known as: HYGROTON   empagliflozin 10 MG Tabs tablet Commonly known as: Jardiance       TAKE these medications    albuterol 108 (90 Base) MCG/ACT inhaler Commonly known as: VENTOLIN HFA Inhale 1-2 puffs into the lungs every 6 (six) hours as needed for wheezing or shortness of breath.   amLODipine 5 MG tablet Commonly known as: NORVASC Take 1 tablet (5 mg total) by mouth daily. Start taking on: October 31, 2022   atorvastatin 80 MG tablet Commonly known as: LIPITOR Take 1 tablet (80 mg total) by mouth daily at 6 PM. What changed:  medication strength how much to take   clopidogrel 75 MG tablet Commonly known as: PLAVIX Take 1 tablet (75 mg total) by mouth daily.   lisinopril 10 MG tablet Commonly known as: ZESTRIL Take 10 mg by mouth daily.   metFORMIN 500 MG tablet Commonly known as: GLUCOPHAGE Take 500 mg by mouth 2 (two) times daily with a meal.   metoprolol tartrate 25 MG tablet Commonly known as: LOPRESSOR Take 25 mg by mouth 2 (two) times daily.   nitroGLYCERIN 0.4 MG SL tablet Commonly known as: NITROSTAT Place 1 tablet (0.4 mg total) under the tongue every 5 (five) minutes x 3 doses as needed for chest pain.           Outstanding Labs/Studies   LFT/FLP in 8 weeks   Duration of Discharge Encounter   Greater than 30 minutes  including physician time.  Signed, Laverda Page, NP 10/30/2022, 11:17 AM  I have personally seen and examined this patient. I agree with the assessment and plan as outlined above.  No chest pain this am. CAD stable by cath. Continue medical management of CAD.  Discharge home today.   Laverda Page, MD, [facility] 10/30/2022 11:17 AM

## 2022-10-31 LAB — LIPOPROTEIN A (LPA): Lipoprotein (a): 12.3 nmol/L (ref <75.0)

## 2022-11-04 ENCOUNTER — Telehealth: Payer: Self-pay

## 2022-11-04 NOTE — Telephone Encounter (Signed)
[facility] client with recent ER visit that [facility] LPN was following up with.  Discussion regarding increase in client's A1C at this most recent admission of 9.3 on 10/30/22.. Last A1C from [facility] reported on 09/24/21 was 7.9 she was last seen on 10/02/21 , missed last scheduled appointment of 11/01/21 and 11/01/22. No upcoming appointments showing in [facility] schedule.  Attempted to call to follow up regarding Diabetes and no answer, left voicemail requesting return call.   Plan : [facility] team attempting to renew client with [facility], re-establish PCP and assist in application to Medicaid, as well as possible [facility] financial assistance application.  Barriers to care: uninsured and cost of medications prescribed as well as Transportation for medical appointments and possible food insecurity.      Francee Nodal RN [employer]

## 2022-11-04 NOTE — Congregational Nurse Program (Signed)
Completed a hospital f/u nurse case management call as a result of the pt being seen at the [facility] ER on 6.18.24.  I Successfully spoke with patient on today by way of 2nd attempt.     She states she is doing ok since being released from the hospital, but has not followed up with the PCP  Pt states she was not bee taking her medication on time due to forgetting. She states she has not been attend her scheduled appts due to lack of dependable transportation and having to get rides from others.      Pt had PCP follow up appt on 6.21.24 at 10:30am with the [facility] but did not show.  States she will attempt to contact the [facility] on her own to reschedule her PCP.   However she stated that she does have a cardiology appt scheduled on the 7.3.24 with [facility]  Plan Pt was reminded of importance to stay connected with PCP at [facility] for sick or routine follow up visits  Pt was re-educated on the use of Urgent vs Emergency room in the event his PCP office is not available or open for business.    Urgent vs emergency conditions were also reviewed with the patient.     Pt was reminded about her expired [facility] Uninsured Program enrollment and advised to schedule appt on this week to RENEW enrollment which will help assist with maintaining connections with various health care resources  Pt scheduled an [facility] Uninsured RENEWAL Enrollment appt for Thurs, 6.27.24 at 3:30pm in person at [facility] office at [address].   Also she will complete a Medicaid application at the same time as her visit   Pt was advised on how she can obtain transportation to her medical appointment, when needed once she completes her renewal enrollment   Pt was educated on how to put various medication reminders into place to assist with taking her medication more on time (i.e. pill case to sort medicines reminder alert for phone   Pt states she understood and advised to contact me back if she has  additional questions/concerns  [phone]) and call ended

## 2022-11-04 NOTE — Congregational Nurse Program (Signed)
Made first attempt to f/u with pt by phone but pt was unavailable at the time of the call.  Pt's sister answered phone [phone]) which was incorrect number to directly reach the patient.    Sister provided correct number (see updated phone number in epic patient profile)  This call was in attempt to provide nurse case management to ensure pt did not have any additional questions or concerns regarding any post hospital instruction,medical concerns, obtaining medications and to serve as a reminder of any upcoming appointments, and to address any other socio determinant of health needs, if applicable.  This is also in attempt to reach pt to remind of renewal of her [facility] Uninsured Program enrollment that expired on 10.10.23 and for her to receive assistance on connecting with PCP and other healthcare needs .   Will attempt to make additional follow up call, if pt does not attempt  to return call first

## 2022-11-07 ENCOUNTER — Other Ambulatory Visit: Payer: Self-pay

## 2022-11-08 ENCOUNTER — Telehealth: Payer: Self-pay

## 2022-11-08 NOTE — Telephone Encounter (Signed)
[facility] client sent text message as she is currently at work. Reminder that she needs to call the [facility] and reschedule a follow up appointment and provide [facility] with the date and time and pick up location for the [facility] appointment. As well as the pick up location for transportation for her 11/13/22 appointment to [facility] in [place].   Unable to schedule transportation until that information is provided. Client is aware that will need that information today for the 11/13/22 appointment.   Francee Nodal RN [employer]

## 2022-11-11 ENCOUNTER — Telehealth: Payer: Self-pay

## 2022-11-11 NOTE — Telephone Encounter (Signed)
Contacted [facility] client that her transporation has been arranged for her appointment in [place] with [facility] on [facility]22 for her 2:30 appointment confirmed date and time in EPIC. Notified that vendor Juel Burrow will contact her Tuesday evening with pick up time.   Will continue to follow    Francee Nodal RN [employer]/[facility]

## 2022-11-13 ENCOUNTER — Ambulatory Visit: Payer: Self-pay | Admitting: Nurse Practitioner

## 2022-11-13 ENCOUNTER — Telehealth: Payer: Self-pay

## 2022-11-13 NOTE — Telephone Encounter (Signed)
Client text messaged that she had an emergency and asked to stop transportation for her Cardiology appt that is today at 2:30 PM. Contacted [facility] transportation to make them aware to notify the driver.    Francee Nodal RN [employer]

## 2022-11-13 NOTE — Progress Notes (Deleted)
  Cardiology Office Note:  .   Date:  11/13/2022  ID:  Laura Frost, DOB 1965/11/04, MRN [id number] PCP: Health, [facility]   [facility] Providers Cardiologist:  Nona Dell, MD { Click to update primary MD,subspecialty MD or APP then REFRESH:1}   History of Present Illness: .   Laura Frost is a 57 y.o. female with a PMH of CAD, s/p NSTEMI, HTN, T2DM, and tobacco abuse, who presents today for hospital follow-up.   Recently admitted for CP. Underwent cardiac cath on 10/29/2022 d/t elevated troponin, noted to have nonobstructive CAD, see report below. Medical management recommended. Jardiance was held d/t concern for yeast infection.   Today she presents for follow-up. She states...   ROS: ***  Studies Reviewed: .        *** Risk Assessment/Calculations:   {Does this patient have ATRIAL FIBRILLATION?[phone]} No BP recorded.  {Refresh Note OR Click here to enter BP  :1}***       Physical Exam:   VS:  There were no vitals taken for this visit.   Wt Readings from Last 3 Encounters:  10/30/22 171 lb 11.2 oz (77.9 kg)  03/10/22 173 lb 11.6 oz (78.8 kg)  06/09/21 171 lb 15.3 oz (78 kg)    GEN: Well nourished, well developed in no acute distress NECK: No JVD; No carotid bruits CARDIAC: ***RRR, no murmurs, rubs, gallops RESPIRATORY:  Clear to auscultation without rales, wheezing or rhonchi  ABDOMEN: Soft, non-tender, non-distended EXTREMITIES:  No edema; No deformity   ASSESSMENT AND PLAN: .   *** {The patient has an active order for outpatient cardiac rehabilitation.   Please indicate if the patient is ready to start. Do NOT delete this.  It will auto delete.  Refresh note, then sign.              Click here to document readiness and see contraindications.  :1}  Cardiac Rehabilitation Eligibility Assessment      {Are you ordering a CV Procedure (e.g. stress test, cath, DCCV, TEE, etc)?   Press F2        :[id number]}  Dispo: ***  Signed, Sharlene Dory, NP

## 2022-11-21 ENCOUNTER — Ambulatory Visit: Payer: Self-pay | Attending: Cardiology

## 2022-11-21 NOTE — Progress Notes (Deleted)
Patient ID: Laura Frost                 DOB: 11-01-1965                    MRN: [id number]      HPI: Laura Frost is a 57 y.o. female patient referred to lipid clinic by  ***. PMH is significant for    Reviewed options for lowering LDL cholesterol, including ezetimibe, PCSK-9 inhibitors, bempedoic acid and inclisiran.  Discussed mechanisms of action, dosing, side effects and potential decreases in LDL cholesterol.  Also reviewed cost information and potential options for patient assistance.  Current Medications:  Intolerances:  Risk Factors:  LDL-C goal:  ApoB goal:   Diet:   Exercise:   Family History:   Social History:   Labs: Lipid Panel     Component Value Date/Time   CHOL 242 (H) 10/30/2022 0033   TRIG 498 (H) 10/30/2022 0033   HDL 44 10/30/2022 0033   CHOLHDL 5.5 10/30/2022 0033   VLDL UNABLE TO CALCULATE IF TRIGLYCERIDE OVER 400 mg/dL 40/98/1191 4782   LDLCALC UNABLE TO CALCULATE IF TRIGLYCERIDE OVER 400 mg/dL 95/62/1308 6578   LDLDIRECT 128 (H) 10/30/2022 0033    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   Cervical cancer (HCC)    Hypertension    Type 2 diabetes mellitus (HCC)     Current Outpatient Medications on File Prior to Visit  Medication Sig Dispense Refill   albuterol (PROVENTIL HFA;VENTOLIN HFA) 108 (90 Base) MCG/ACT inhaler Inhale 1-2 puffs into the lungs every 6 (six) hours as needed for wheezing or shortness of breath.     amLODipine (NORVASC) 5 MG tablet Take 1 tablet (5 mg total) by mouth daily. 30 tablet 2   atorvastatin (LIPITOR) 80 MG tablet Take 1 tablet (80 mg total) by mouth daily at 6 PM. 30 tablet 2   clopidogrel (PLAVIX) 75 MG tablet Take 1 tablet (75 mg total) by mouth daily. 90 tablet 3   lisinopril (PRINIVIL,ZESTRIL) 10 MG tablet Take 10 mg by mouth daily.     metFORMIN (GLUCOPHAGE) 500 MG tablet Take 500 mg by mouth 2 (two) times daily  with a meal.     metoprolol tartrate (LOPRESSOR) 25 MG tablet Take 25 mg by mouth 2 (two) times daily.     nitroGLYCERIN (NITROSTAT) 0.4 MG SL tablet Place 1 tablet (0.4 mg total) under the tongue every 5 (five) minutes x 3 doses as needed for chest pain. 25 tablet 3   No current facility-administered medications on file prior to visit.    Allergies  Allergen Reactions   Asa [Aspirin]     Told as child she was allergic    Assessment/Plan:  1. Hyperlipidemia -  No problem-specific Assessment & Plan notes found for this encounter.    Thank you,  Olene Floss, Pharm.D, BCPS, CPP [facility] [address], [place], [place] [address]  Phone: [phone]; Fax: [phone]

## 2022-11-27 ENCOUNTER — Telehealth: Payer: Self-pay

## 2022-11-27 NOTE — Telephone Encounter (Addendum)
Client called today asking about her Medicaid. Had client talk with A. McCray of [facility] that has been assisting with the [facility] process as well as Medicaid.  Client will supply the needed paystubs to be able to complete enrollment and apply for Medicaid. Client was provided with documents she needs to provide by A. McCray.  I discussed rescheduling her cardiology appointment she missed as well as calling the [facility] as her PCP and scheduling a follow up appointment. This will assure she does not run out of medications. She states understanding. She is also urged to call once she has scheduled at least 3 business days before Medical appointments if she needs assistance with transportation. She states understanding.   Will continue to follow as needed.   Laura Nodal RN [employer]

## 2022-12-10 ENCOUNTER — Telehealth: Payer: Self-pay

## 2022-12-10 NOTE — Telephone Encounter (Signed)
Reached out to Ms Casagrande for follow up as she has not yet completed the [facility] enrollment, we are needing more documents. Reached out via text message as client works during the day. Also reminded her in checking that she has not rescheduled her cardiology appointment nor made an appointment with her [facility], Requested that she call [facility] office for assistance.    Francee Nodal RN [employer]

## 2022-12-25 ENCOUNTER — Telehealth: Payer: Self-pay

## 2022-12-25 NOTE — Telephone Encounter (Signed)
Attempted to contact Ms Scruton, no answer, left VM and also sent text message requesting her to contact [facility] office for information we have her regarding her Medicaid application. Also wanted to remind her to reschedule appointments with [facility] for follow ups  Francee Nodal RN [facility] [employer]/[facility]

## 2023-01-12 DIAGNOSIS — Z419 Encounter for procedure for purposes other than remedying health state, unspecified: Secondary | ICD-10-CM | POA: Diagnosis not present

## 2023-02-11 DIAGNOSIS — Z419 Encounter for procedure for purposes other than remedying health state, unspecified: Secondary | ICD-10-CM | POA: Diagnosis not present

## 2023-02-25 NOTE — Progress Notes (Unsigned)
   New Patient Office Visit   Subjective   Patient ID: Laura Frost, female    DOB: 06-02-1965  Age: 57 y.o. MRN: [id number]  CC: No chief complaint on file.   HPI Laura Frost 57 year old female, presents to establish care. She  has a past medical history of Bronchitis, CAD (coronary artery disease), Cervical cancer (HCC), Hypertension, and Type 2 diabetes mellitus (HCC).  HPI    Outpatient Encounter Medications as of 02/26/2023  Medication Sig   albuterol (PROVENTIL HFA;VENTOLIN HFA) 108 (90 Base) MCG/ACT inhaler Inhale 1-2 puffs into the lungs every 6 (six) hours as needed for wheezing or shortness of breath.   amLODipine (NORVASC) 5 MG tablet Take 1 tablet (5 mg total) by mouth daily.   atorvastatin (LIPITOR) 80 MG tablet Take 1 tablet (80 mg total) by mouth daily at 6 PM.   clopidogrel (PLAVIX) 75 MG tablet Take 1 tablet (75 mg total) by mouth daily.   lisinopril (PRINIVIL,ZESTRIL) 10 MG tablet Take 10 mg by mouth daily.   metFORMIN (GLUCOPHAGE) 500 MG tablet Take 500 mg by mouth 2 (two) times daily with a meal.   metoprolol tartrate (LOPRESSOR) 25 MG tablet Take 25 mg by mouth 2 (two) times daily.   nitroGLYCERIN (NITROSTAT) 0.4 MG SL tablet Place 1 tablet (0.4 mg total) under the tongue every 5 (five) minutes x 3 doses as needed for chest pain.   No facility-administered encounter medications on file as of 02/26/2023.    Past Surgical History:  Procedure Laterality Date   LEFT HEART CATH AND CORONARY ANGIOGRAPHY N/A 02/15/2021   Procedure: LEFT HEART CATH AND CORONARY ANGIOGRAPHY;  Surgeon: Runell Gess, MD;  Location: [facility] INVASIVE CV LAB;  Service: Cardiovascular;  Laterality: N/A;   LEFT HEART CATH AND CORONARY ANGIOGRAPHY N/A 10/29/2022   Procedure: LEFT HEART CATH AND CORONARY ANGIOGRAPHY;  Surgeon: Corky Crafts, MD;  Location: [facility] INVASIVE CV LAB;  Service: Cardiovascular;  Laterality: N/A;   OVARY SURGERY      ROS    Objective    There were no vitals  taken for this visit.  Physical Exam    Assessment & Plan:  There are no diagnoses linked to this encounter.  No follow-ups on file.   Cruzita Lederer Newman Nip, FNP

## 2023-02-26 ENCOUNTER — Ambulatory Visit: Payer: Medicaid Other | Admitting: Family Medicine

## 2023-02-26 ENCOUNTER — Other Ambulatory Visit: Payer: Self-pay | Admitting: Family Medicine

## 2023-02-26 ENCOUNTER — Encounter (INDEPENDENT_AMBULATORY_CARE_PROVIDER_SITE_OTHER): Payer: Self-pay | Admitting: *Deleted

## 2023-02-26 ENCOUNTER — Ambulatory Visit (INDEPENDENT_AMBULATORY_CARE_PROVIDER_SITE_OTHER): Payer: Medicaid Other | Admitting: Family Medicine

## 2023-02-26 ENCOUNTER — Encounter: Payer: Self-pay | Admitting: Family Medicine

## 2023-02-26 VITALS — BP 140/57 | HR 71 | Resp 16 | Ht 60.0 in | Wt 166.1 lb

## 2023-02-26 DIAGNOSIS — R7301 Impaired fasting glucose: Secondary | ICD-10-CM | POA: Diagnosis not present

## 2023-02-26 DIAGNOSIS — Z7689 Persons encountering health services in other specified circumstances: Secondary | ICD-10-CM | POA: Diagnosis not present

## 2023-02-26 DIAGNOSIS — N898 Other specified noninflammatory disorders of vagina: Secondary | ICD-10-CM | POA: Diagnosis not present

## 2023-02-26 DIAGNOSIS — Z1159 Encounter for screening for other viral diseases: Secondary | ICD-10-CM | POA: Diagnosis not present

## 2023-02-26 DIAGNOSIS — E559 Vitamin D deficiency, unspecified: Secondary | ICD-10-CM | POA: Diagnosis not present

## 2023-02-26 DIAGNOSIS — I10 Essential (primary) hypertension: Secondary | ICD-10-CM

## 2023-02-26 DIAGNOSIS — Z7984 Long term (current) use of oral hypoglycemic drugs: Secondary | ICD-10-CM

## 2023-02-26 DIAGNOSIS — E119 Type 2 diabetes mellitus without complications: Secondary | ICD-10-CM | POA: Diagnosis not present

## 2023-02-26 DIAGNOSIS — Z1211 Encounter for screening for malignant neoplasm of colon: Secondary | ICD-10-CM

## 2023-02-26 DIAGNOSIS — Z1231 Encounter for screening mammogram for malignant neoplasm of breast: Secondary | ICD-10-CM

## 2023-02-26 MED ORDER — HYDROXYZINE PAMOATE 25 MG PO CAPS: 25.0000 mg | ORAL_CAPSULE | Freq: Three times a day (TID) | ORAL | 1 refills | Status: AC | PRN

## 2023-02-26 MED ORDER — ALBUTEROL SULFATE HFA 108 (90 BASE) MCG/ACT IN AERS: 1.0000 | INHALATION_SPRAY | Freq: Four times a day (QID) | RESPIRATORY_TRACT | 5 refills | Status: DC | PRN

## 2023-02-26 MED ORDER — METOPROLOL TARTRATE 25 MG PO TABS: 25.0000 mg | ORAL_TABLET | Freq: Two times a day (BID) | ORAL | 2 refills | Status: DC

## 2023-02-26 MED ORDER — BLOOD PRESSURE KIT DEVI
0 refills | Status: DC
Start: 2023-02-26 — End: 2023-07-30

## 2023-02-26 MED ORDER — METFORMIN HCL 500 MG PO TABS: 500.0000 mg | ORAL_TABLET | Freq: Two times a day (BID) | ORAL | 3 refills | Status: DC

## 2023-02-26 MED ORDER — ATORVASTATIN CALCIUM 80 MG PO TABS: 80.0000 mg | ORAL_TABLET | Freq: Every day | ORAL | 2 refills | Status: DC

## 2023-02-26 MED ORDER — CLOPIDOGREL BISULFATE 75 MG PO TABS: 75.0000 mg | ORAL_TABLET | Freq: Every day | ORAL | 3 refills | Status: AC

## 2023-02-26 MED ORDER — DEXCOM G7 SENSOR MISC: 1 refills | Status: AC

## 2023-02-26 MED ORDER — NITROGLYCERIN 0.4 MG SL SUBL: 0.4000 mg | SUBLINGUAL_TABLET | SUBLINGUAL | 3 refills | Status: AC | PRN

## 2023-02-26 MED ORDER — LISINOPRIL 10 MG PO TABS: 10.0000 mg | ORAL_TABLET | Freq: Every day | ORAL | 3 refills | Status: DC

## 2023-02-26 MED ORDER — AMLODIPINE BESYLATE 5 MG PO TABS: 5.0000 mg | ORAL_TABLET | Freq: Every day | ORAL | 2 refills | Status: DC

## 2023-02-26 NOTE — Assessment & Plan Note (Signed)
Hemoglobin A1C 9.3 not at a goal, Labs ordered today awaiting results will follow up. Patient reports taking Metformin 500 mg twice daily. Discussed  Nonpharmacological interventions such as low carb diet,high in protein, vegetables and fruit discussed. Educated on importance of physical activity 150 minutes per week. Discussed signs and symptoms of hypoglycemia, & hyperglycemia and need to present to the ED if symptoms occurs.Follow up in 3 months or sooner if needed. Patient verbalizes understanding regarding plan of care and all questions answered. Ophthalmology referral placed , Foot exam within desired limits

## 2023-02-26 NOTE — Assessment & Plan Note (Signed)
Urinalysis, NuSwab and urine culture ordered- Awaiting results will follow up. May take OTC AZO for urinary pain relief. Explained to increase oral fluid intake. Drink 8 glasses of water daily.  Follow up for worsening or persistent symptoms. Patient verbalizes understanding regarding plan of care and all questions answered.

## 2023-02-26 NOTE — Patient Instructions (Signed)

## 2023-02-26 NOTE — Assessment & Plan Note (Signed)
Vitals:   02/26/23 0809 02/26/23 0840  BP: (!) 153/88 (!) 140/57  Patient reports taking amlodipine 5 mg daily , Lisinopril 10 mg daily and Metoprolol 25 mg twice daily Did not take medications today since she out of refills  Labs ordered. Discussed with  patient to monitor their blood pressure regularly and maintain a heart-healthy diet rich in fruits, vegetables, whole grains, and low-fat dairy, while reducing sodium intake to less than 2,300 mg per day. Regular physical activity, such as 30 minutes of moderate exercise most days of the week, will help lower blood pressure and improve overall cardiovascular health. Avoiding smoking, limiting alcohol consumption, and managing stress. Take  prescribed medication, & take it as directed and avoid skipping doses. Seek emergency care if your blood pressure is (over 180/120) or you experience chest pain, shortness of breath, or sudden vision changes.Patient verbalizes understanding regarding plan of care and all questions answered. Follow up in  1 week with at home blood pressure to monitor trends.

## 2023-02-27 ENCOUNTER — Other Ambulatory Visit: Payer: Self-pay | Admitting: Family Medicine

## 2023-02-27 DIAGNOSIS — R768 Other specified abnormal immunological findings in serum: Secondary | ICD-10-CM

## 2023-02-27 MED ORDER — VITAMIN D3 25 MCG (1000 UT) PO CAPS: 1000.0000 [IU] | ORAL_CAPSULE | Freq: Every day | ORAL | 2 refills | Status: AC

## 2023-02-27 MED ORDER — OMEGA-3 FISH OIL 1000 MG PO CAPS: 2.0000 | ORAL_CAPSULE | Freq: Every day | ORAL | 3 refills | Status: DC

## 2023-02-27 NOTE — Progress Notes (Signed)
Please inform patient,   Cholesterol levels elevated,  Plan of treatment will include Lipitor 80 mg once daily, I encourage over the counter omega-3 fish oil 1000 mg twice daily.  Start lifestyle modifications follow diet low in saturated fat, reduce dietary salt intake, avoid fatty foods, maintain an exercise routine 3 to 5 days a week for a minimum total of 150 minutes.      Hep C positive HCV antibody results should be followed up with an HCV RNA test  to confirm the diagnosis of active HCV infection.   Please follow up in 1-2 weeks for repeat blood work ONLY. You can come into the clinic Monday-Friday 8-11 am make sure you're fasting for labs.   Vitamin D levels low, I advise to taking  over the counter supplements of vitamin D 1000 IU/day to prevent low vitamin D levels. Consuming Vitamin D rich food sources include fish, salmon, sardines, egg yolks, red meat, liver, oranges, soy milk.    Urine and Nuswab are still pending

## 2023-02-28 ENCOUNTER — Other Ambulatory Visit: Payer: Self-pay | Admitting: Family Medicine

## 2023-02-28 LAB — TSH+FREE T4
Free T4: 1.32 ng/dL (ref 0.82–1.77)
TSH: 2.5 u[IU]/mL (ref 0.450–4.500)

## 2023-02-28 LAB — CBC WITH DIFFERENTIAL/PLATELET
Basophils Absolute: 0.1 10*3/uL (ref 0.0–0.2)
Basos: 1 %
EOS (ABSOLUTE): 0.2 10*3/uL (ref 0.0–0.4)
Eos: 3 %
Hematocrit: 47.6 % — ABNORMAL HIGH (ref 34.0–46.6)
Hemoglobin: 15.8 g/dL (ref 11.1–15.9)
Immature Grans (Abs): 0 10*3/uL (ref 0.0–0.1)
Immature Granulocytes: 0 %
Lymphocytes Absolute: 2.1 10*3/uL (ref 0.7–3.1)
Lymphs: 37 %
MCH: 33.2 pg — ABNORMAL HIGH (ref 26.6–33.0)
MCHC: 33.2 g/dL (ref 31.5–35.7)
MCV: 100 fL — ABNORMAL HIGH (ref 79–97)
Monocytes Absolute: 0.4 10*3/uL (ref 0.1–0.9)
Monocytes: 6 %
Neutrophils Absolute: 2.9 10*3/uL (ref 1.4–7.0)
Neutrophils: 53 %
Platelets: 198 10*3/uL (ref 150–450)
RBC: 4.76 x10E6/uL (ref 3.77–5.28)
RDW: 11.8 % (ref 11.7–15.4)
WBC: 5.6 10*3/uL (ref 3.4–10.8)

## 2023-02-28 LAB — CMP14+EGFR
ALT: 24 [IU]/L (ref 0–32)
AST: 15 [IU]/L (ref 0–40)
Albumin: 4.2 g/dL (ref 3.8–4.9)
Alkaline Phosphatase: 111 [IU]/L (ref 44–121)
BUN/Creatinine Ratio: 11 (ref 9–23)
BUN: 8 mg/dL (ref 6–24)
Bilirubin Total: 0.4 mg/dL (ref 0.0–1.2)
CO2: 26 mmol/L (ref 20–29)
Calcium: 9.4 mg/dL (ref 8.7–10.2)
Chloride: 98 mmol/L (ref 96–106)
Creatinine, Ser: 0.74 mg/dL (ref 0.57–1.00)
Globulin, Total: 2.7 g/dL (ref 1.5–4.5)
Glucose: 275 mg/dL — ABNORMAL HIGH (ref 70–99)
Potassium: 4.1 mmol/L (ref 3.5–5.2)
Sodium: 136 mmol/L (ref 134–144)
Total Protein: 6.9 g/dL (ref 6.0–8.5)
eGFR: 94 mL/min/{1.73_m2} (ref >59)

## 2023-02-28 LAB — URINALYSIS
Bilirubin, UA: NEGATIVE
Ketones, UA: NEGATIVE
Leukocytes,UA: NEGATIVE
Nitrite, UA: NEGATIVE
RBC, UA: NEGATIVE
Specific Gravity, UA: >=1.03 — AB (ref 1.005–1.030)
Urobilinogen, Ur: 0.2 mg/dL (ref 0.2–1.0)
pH, UA: 5.5 (ref 5.0–7.5)

## 2023-02-28 LAB — HEMOGLOBIN A1C
Est. average glucose Bld gHb Est-mCnc: 278 mg/dL
Hgb A1c MFr Bld: 11.3 % — ABNORMAL HIGH (ref 4.8–5.6)

## 2023-02-28 LAB — LIPID PANEL
Chol/HDL Ratio: 4.8 {ratio} — ABNORMAL HIGH (ref 0.0–4.4)
Cholesterol, Total: 251 mg/dL — ABNORMAL HIGH (ref 100–199)
HDL: 52 mg/dL (ref >39)
LDL Chol Calc (NIH): 161 mg/dL — ABNORMAL HIGH (ref 0–99)
Triglycerides: 205 mg/dL — ABNORMAL HIGH (ref 0–149)
VLDL Cholesterol Cal: 38 mg/dL (ref 5–40)

## 2023-02-28 LAB — MICROALBUMIN / CREATININE URINE RATIO
Creatinine, Urine: 153.8 mg/dL
Microalb/Creat Ratio: 7 mg/g{creat} (ref 0–29)
Microalbumin, Urine: 11.4 ug/mL

## 2023-02-28 LAB — VITAMIN D 25 HYDROXY (VIT D DEFICIENCY, FRACTURES): Vit D, 25-Hydroxy: 11.2 ng/mL — ABNORMAL LOW (ref 30.0–100.0)

## 2023-02-28 LAB — URINE CULTURE

## 2023-02-28 LAB — HEPATITIS C ANTIBODY: Hep C Virus Ab: REACTIVE — AB

## 2023-02-28 MED ORDER — OZEMPIC (0.25 OR 0.5 MG/DOSE) 2 MG/3ML ~~LOC~~ SOPN
0.2500 mg | PEN_INJECTOR | SUBCUTANEOUS | 0 refills | Status: DC
Start: 2023-02-28 — End: 2023-04-08

## 2023-02-28 MED ORDER — METFORMIN HCL 1000 MG PO TABS: 1000.0000 mg | ORAL_TABLET | Freq: Two times a day (BID) | ORAL | 3 refills | Status: DC

## 2023-02-28 MED ORDER — GLIPIZIDE 10 MG PO TABS
10.0000 mg | ORAL_TABLET | Freq: Every day | ORAL | 3 refills | Status: DC
Start: 2023-02-28 — End: 2023-04-08

## 2023-02-28 NOTE — Progress Notes (Signed)
Please inform patient,  Hemoglobin A1c 11.3 Type 2 diabetes not controlled.  Plan of treatment will include: Metformin 1000 mg twice daily Glipizide 10 mg once daily Ozempic weekly injection- we will need to increase dosage monthly. Medications sent to pharmacy   A Sample Day of Eating for Diabetes or  Breakfast: Oatmeal with a handful of berries and a sprinkle of chia seeds, paired with a boiled egg.  Lunch: Grilled chicken breast on a bed of spinach and kale, topped with avocado, a drizzle of olive oil, and a slice of whole grain bread.  Snack: A handful of almonds or a small apple with peanut butter.  Dinner: Baked salmon with roasted broccoli and quinoa.  Snack (if needed): A piece of cheese or a small bowl of Greek yogurt.  Find an activity that you will enjoyandstart to be active at least 5 days a week for 30 minutes each day.

## 2023-03-01 LAB — NUSWAB VAGINITIS PLUS (VG+)
Atopobium vaginae: HIGH {score} — AB
BVAB 2: HIGH {score} — AB
Candida albicans, NAA: NEGATIVE
Candida glabrata, NAA: NEGATIVE

## 2023-03-02 ENCOUNTER — Other Ambulatory Visit: Payer: Self-pay | Admitting: Family Medicine

## 2023-03-02 MED ORDER — METRONIDAZOLE 0.75 % VA GEL: 1.0000 | Freq: Every day | VAGINAL | 0 refills | Status: AC

## 2023-03-02 NOTE — Progress Notes (Signed)
Please inform patient, Results shows you have bacterial vaginosis (BV), which means there's an imbalance in your vaginal bacteria. To help, avoid douching, wear cotton underwear, and stay away from scented products. Keep the area clean and dry,   Plan of treatment will include Metronidazole vaginal gel 0.75% x 5 days  Medication sent to pharmacy

## 2023-03-14 DIAGNOSIS — Z419 Encounter for procedure for purposes other than remedying health state, unspecified: Secondary | ICD-10-CM | POA: Diagnosis not present

## 2023-04-07 NOTE — Progress Notes (Unsigned)
   Established Patient Office Visit   Subjective  Patient ID: Laura Frost, female    DOB: [birth year]  Age: 57 y.o. MRN: [id number]  No chief complaint on file.   She  has a past medical history of Bronchitis, CAD (coronary artery disease), Cervical cancer (HCC), Hypertension, and Type 2 diabetes mellitus (HCC).  HPI  ROS    Objective:     There were no vitals taken for this visit. {Vitals History (Optional):23777}  Physical Exam   No results found for any visits on 04/08/23.  The ASCVD Risk score (Arnett DK, et al., 2019) failed to calculate for the following reasons:   The patient has a prior MI or stroke diagnosis    Assessment & Plan:  There are no diagnoses linked to this encounter.  No follow-ups on file.   Cruzita Lederer Newman Nip, FNP

## 2023-04-07 NOTE — Patient Instructions (Signed)

## 2023-04-08 ENCOUNTER — Ambulatory Visit (INDEPENDENT_AMBULATORY_CARE_PROVIDER_SITE_OTHER): Payer: Medicaid Other | Admitting: Family Medicine

## 2023-04-08 ENCOUNTER — Other Ambulatory Visit (HOSPITAL_COMMUNITY)
Admission: RE | Admit: 2023-04-08 | Discharge: 2023-04-08 | Disposition: A | Discharge status: Home / Self Care | Payer: Medicaid Other | Source: Ambulatory Visit | Attending: Family Medicine | Admitting: Family Medicine

## 2023-04-08 VITALS — BP 147/98 | HR 89 | Ht 60.0 in | Wt 164.1 lb

## 2023-04-08 DIAGNOSIS — Z124 Encounter for screening for malignant neoplasm of cervix: Secondary | ICD-10-CM | POA: Insufficient documentation

## 2023-04-08 DIAGNOSIS — E119 Type 2 diabetes mellitus without complications: Secondary | ICD-10-CM

## 2023-04-08 DIAGNOSIS — R0602 Shortness of breath: Secondary | ICD-10-CM | POA: Diagnosis not present

## 2023-04-08 DIAGNOSIS — Z1211 Encounter for screening for malignant neoplasm of colon: Secondary | ICD-10-CM | POA: Diagnosis not present

## 2023-04-08 DIAGNOSIS — J069 Acute upper respiratory infection, unspecified: Secondary | ICD-10-CM | POA: Insufficient documentation

## 2023-04-08 DIAGNOSIS — Z23 Encounter for immunization: Secondary | ICD-10-CM | POA: Diagnosis not present

## 2023-04-08 DIAGNOSIS — Z7689 Persons encountering health services in other specified circumstances: Secondary | ICD-10-CM | POA: Diagnosis not present

## 2023-04-08 MED ORDER — ATORVASTATIN CALCIUM 80 MG PO TABS: 80.0000 mg | ORAL_TABLET | Freq: Every day | ORAL | 2 refills | Status: DC

## 2023-04-08 MED ORDER — AMLODIPINE BESYLATE 5 MG PO TABS: 5.0000 mg | ORAL_TABLET | Freq: Every day | ORAL | 2 refills | Status: DC

## 2023-04-08 MED ORDER — GLIPIZIDE 10 MG PO TABS: 10.0000 mg | ORAL_TABLET | Freq: Every day | ORAL | 3 refills | Status: DC

## 2023-04-08 MED ORDER — OMEGA-3 FISH OIL 1000 MG PO CAPS: 2.0000 | ORAL_CAPSULE | Freq: Every day | ORAL | 3 refills | Status: AC

## 2023-04-08 MED ORDER — AZITHROMYCIN 250 MG PO TABS: ORAL_TABLET | ORAL | 0 refills | Status: DC

## 2023-04-08 MED ORDER — METOPROLOL SUCCINATE ER 25 MG PO TB24: 12.5000 mg | ORAL_TABLET | Freq: Every day | ORAL | 2 refills | Status: DC

## 2023-04-08 MED ORDER — OZEMPIC (0.25 OR 0.5 MG/DOSE) 2 MG/3ML ~~LOC~~ SOPN: 0.2500 mg | PEN_INJECTOR | SUBCUTANEOUS | 0 refills | Status: DC

## 2023-04-08 MED ORDER — METFORMIN HCL 1000 MG PO TABS: 1000.0000 mg | ORAL_TABLET | Freq: Two times a day (BID) | ORAL | 3 refills | Status: DC

## 2023-04-08 MED ORDER — ALBUTEROL SULFATE HFA 108 (90 BASE) MCG/ACT IN AERS: 1.0000 | INHALATION_SPRAY | Freq: Four times a day (QID) | RESPIRATORY_TRACT | 5 refills | Status: AC | PRN

## 2023-04-08 NOTE — Assessment & Plan Note (Signed)
Azithromycin 250 mg twice daily x 5 days Advise patient to rest to support your body's recovery. Stay hydrated by drinking water, tea, or broth. Using a humidifier can help soothe throat irritation and ease nasal congestion. For fever or pain, acetaminophen (Tylenol) is recommended. To relieve other symptoms, try saline nasal sprays, throat lozenges, or gargling with saltwater. Focus on eating light, healthy meals like fruits and vegetables to keep your strength up. Practice good hygiene by washing your hands frequently and covering your mouth when coughing or sneezing.Follow-up for worsening or persistent symptoms. Patient verbalizes understanding regarding plan of care and all questions answered

## 2023-04-08 NOTE — Assessment & Plan Note (Signed)
Pap smear done, Patient tolerated procedure well. Informed patient I will keep them updated on results. Breasts appear normal, no suspicious masses, no skin or nipple changes or axillary nodes, right breast normal without mass, skin or nipple changes or axillary nodes, left breast normal without mass, skin or nipple changes or axillary nodes.

## 2023-04-09 LAB — CYTOLOGY - PAP
Adequacy: ABSENT
Diagnosis: NEGATIVE

## 2023-04-09 NOTE — Progress Notes (Signed)
Please inform patient,   Pap smear results normal,  Repeat pap in 3 years.

## 2023-04-13 DIAGNOSIS — Z419 Encounter for procedure for purposes other than remedying health state, unspecified: Secondary | ICD-10-CM | POA: Diagnosis not present

## 2023-04-16 ENCOUNTER — Telehealth: Payer: Self-pay

## 2023-04-16 NOTE — Telephone Encounter (Signed)
Please read results of nuswab on my chart 02/26/23

## 2023-04-16 NOTE — Telephone Encounter (Signed)
Copied from CRM [id number]. Topic: Clinical - Lab/Test Results >> Apr 16, 2023 10:03 AM Prudencio Pair wrote: Reason for CRM: Patient stated she missed a call but think it was in regards to her Pap Smear results. Checked notes and looks like Atari Novick placed the call. Advised pt that her results per Dr. Tenna Child was normal & to repeat pap in 3 years. Pt wanted to know about BV as well. She stated that she was told something about that. Please give pt a callback in regards to this. CB #: [id number].

## 2023-04-16 NOTE — Telephone Encounter (Signed)
She has been informed  

## 2023-04-30 ENCOUNTER — Other Ambulatory Visit: Payer: Self-pay | Admitting: Family Medicine

## 2023-05-06 ENCOUNTER — Other Ambulatory Visit: Payer: Self-pay | Admitting: Family Medicine

## 2023-05-06 ENCOUNTER — Telehealth: Payer: Self-pay

## 2023-05-06 DIAGNOSIS — E1165 Type 2 diabetes mellitus with hyperglycemia: Secondary | ICD-10-CM

## 2023-05-06 MED ORDER — OZEMPIC (0.25 OR 0.5 MG/DOSE) 2 MG/3ML ~~LOC~~ SOPN: 0.2500 mg | PEN_INJECTOR | SUBCUTANEOUS | 0 refills | Status: AC

## 2023-05-06 NOTE — Telephone Encounter (Signed)
Hi Laura Frost I prescribed this for type 2 diabetes not primary hypertension will send again

## 2023-05-06 NOTE — Telephone Encounter (Signed)
Provider informed.

## 2023-05-14 DIAGNOSIS — Z419 Encounter for procedure for purposes other than remedying health state, unspecified: Secondary | ICD-10-CM | POA: Diagnosis not present

## 2023-05-22 NOTE — Telephone Encounter (Signed)
 PA has been resent to [facility]. Office Awaiting response.

## 2023-05-27 ENCOUNTER — Ambulatory Visit: Payer: Self-pay | Admitting: Family Medicine

## 2023-05-27 ENCOUNTER — Telehealth: Payer: Self-pay | Admitting: Family Medicine

## 2023-05-27 NOTE — Telephone Encounter (Signed)
  Chief Complaint:  Patient states she has a Bacterial vaginal Infection and requesting refill of medication. Symptoms:  Severe Itching Frequency: 3 months Pertinent Negatives: Patient denies pain. Denies fever. Denies bleeding. Disposition: [] ED /[] Urgent Care (no appt availability in office) / [x] Appointment(In office/virtual)/ []  [facility]/ [] Home Care/ [] Refused Recommended Disposition /[] [facility]/ []  Follow-up with PCP Additional Notes: Scheduled to be seen in office for 05/28/23    Reason for Disposition  MODERATE-SEVERE itching (i.e., interferes with school, work, or sleep)  Answer Assessment - Initial Assessment Questions 1. SYMPTOM: What's the main symptom you're concerned about? (e.g., pain, itching, dryness)     Vaginal Itching 2. LOCATION: Where is the  located? (e.g., inside/outside, left/right)    Vagina   Inside 3. ONSET: When did the  start?      Never stopped itching form the last time 4. PAIN: Is there any pain? If Yes, ask: How bad is it? (Scale: 1-10; mild, moderate, severe)   -    Denies any pain. 5. ITCHING: Is there any itching? If Yes, ask: How bad is it? (Scale: 1-10; mild, moderate, severe)      Sever itchy 6. CAUSE: What do you think is causing the discharge? Have you had the same problem before? What happened then?      Denies  7. OTHER SYMPTOMS: Do you have any other symptoms? (e.g., fever, itching, vaginal bleeding, pain with urination, injury to genital area, vaginal foreign body)      Denies. 8. PREGNANCY: Is there any chance you are pregnant? When was your last menstrual period?      Denies  Protocols used: Vaginal Symptoms-A-AH

## 2023-05-27 NOTE — Telephone Encounter (Unsigned)
 Copied from CRM [id number]. Topic: Clinical - Medication Refill >> May 27, 2023 10:32 AM Rodell CROME wrote: Most Recent Primary Care Visit:  Provider: TERRY WILHELMENA LLOYD HILARIO  Department: [facility]  Visit Type: OFFICE VISIT  Date: 04/08/2023  Medication: metronidazole  vaginal gel   Has the patient contacted their pharmacy? No (Agent: If no, request that the patient contact the pharmacy for the refill. If patient does not wish to contact the pharmacy document the reason why and proceed with request.) (Agent: If yes, when and what did the pharmacy advise?)  Is this the correct pharmacy for this prescription? Yes If no, delete pharmacy and type the correct one.  This is the patient's preferred pharmacy:  [id number] - [place], [place] - [address] AT [facility] [address] [place] [address] [address] Phone: [phone] Fax: [phone]     Has the prescription been filled recently? Yes  Is the patient out of the medication? Yes  Has the patient been seen for an appointment in the last year OR does the patient have an upcoming appointment? Yes  Can we respond through MyChart? Yes  Agent: Please be advised that Rx refills may take up to 3 business days. We ask that you follow-up with your pharmacy.

## 2023-05-28 ENCOUNTER — Ambulatory Visit: Payer: Self-pay | Admitting: Internal Medicine

## 2023-05-28 NOTE — Telephone Encounter (Signed)
 Not a refillable med. Needs ov

## 2023-05-29 NOTE — Telephone Encounter (Signed)
Called patient will keep 02.06.2025

## 2023-06-03 ENCOUNTER — Encounter: Payer: Self-pay | Admitting: Family Medicine

## 2023-06-14 DIAGNOSIS — Z419 Encounter for procedure for purposes other than remedying health state, unspecified: Secondary | ICD-10-CM | POA: Diagnosis not present

## 2023-06-18 NOTE — Patient Instructions (Signed)

## 2023-06-18 NOTE — Progress Notes (Deleted)
   Established Patient Office Visit   Subjective  Patient ID: Annalee Meyerhoff, female    DOB: 07-31-1965  Age: 58 y.o. MRN: [id number]  No chief complaint on file.   She  has a past medical history of Bronchitis, CAD (coronary artery disease), Cervical cancer (HCC), Hypertension, and Type 2 diabetes mellitus (HCC).  HPI  ROS    Objective:     There were no vitals taken for this visit. {Vitals History (Optional):23777}  Physical Exam   No results found for any visits on 06/19/23.  The ASCVD Risk score (Arnett DK, et al., 2019) failed to calculate for the following reasons:   Risk score cannot be calculated because patient has a medical history suggesting prior/existing ASCVD    Assessment & Plan:  There are no diagnoses linked to this encounter.  No follow-ups on file.   Hilario Kidd Wilhelmena Falter, FNP

## 2023-06-19 ENCOUNTER — Encounter: Payer: Self-pay | Admitting: Family Medicine

## 2023-06-19 DIAGNOSIS — E119 Type 2 diabetes mellitus without complications: Secondary | ICD-10-CM

## 2023-06-19 DIAGNOSIS — Z1211 Encounter for screening for malignant neoplasm of colon: Secondary | ICD-10-CM

## 2023-06-19 DIAGNOSIS — I10 Essential (primary) hypertension: Secondary | ICD-10-CM

## 2023-06-19 DIAGNOSIS — Z1231 Encounter for screening mammogram for malignant neoplasm of breast: Secondary | ICD-10-CM

## 2023-06-19 NOTE — Progress Notes (Signed)
 NO SHOW

## 2023-07-01 ENCOUNTER — Encounter: Payer: Self-pay | Admitting: Family Medicine

## 2023-07-02 ENCOUNTER — Other Ambulatory Visit: Payer: Self-pay | Admitting: Family Medicine

## 2023-07-02 ENCOUNTER — Telehealth: Payer: Self-pay

## 2023-07-02 DIAGNOSIS — N898 Other specified noninflammatory disorders of vagina: Secondary | ICD-10-CM

## 2023-07-02 NOTE — Telephone Encounter (Signed)
Patient needs to come in walk in for nuswab and urine

## 2023-07-02 NOTE — Telephone Encounter (Signed)
Pt. Informed. She will come in and perform lab work before appt. Tomorrow.

## 2023-07-02 NOTE — Telephone Encounter (Signed)
Copied from CRM [id number]. Topic: Clinical - Medication Question >> Jul 01, 2023  5:09 PM Antony Haste wrote: Reason for CRM: PT is wanting to know if a different cream can be [facility]. She states the one she currently uses is not efficient. She is still having some minor itchiness. Callback #: [phone]

## 2023-07-03 ENCOUNTER — Ambulatory Visit: Payer: Self-pay | Admitting: Internal Medicine

## 2023-07-12 DIAGNOSIS — Z419 Encounter for procedure for purposes other than remedying health state, unspecified: Secondary | ICD-10-CM | POA: Diagnosis not present

## 2023-07-30 ENCOUNTER — Telehealth: Payer: Self-pay | Admitting: Family Medicine

## 2023-07-30 ENCOUNTER — Ambulatory Visit: Payer: Self-pay | Admitting: Family Medicine

## 2023-07-30 ENCOUNTER — Encounter: Payer: Self-pay | Admitting: Family Medicine

## 2023-07-30 VITALS — BP 121/79 | HR 82 | Resp 16 | Ht 60.0 in | Wt 159.0 lb

## 2023-07-30 DIAGNOSIS — N898 Other specified noninflammatory disorders of vagina: Secondary | ICD-10-CM

## 2023-07-30 DIAGNOSIS — I10 Essential (primary) hypertension: Secondary | ICD-10-CM | POA: Diagnosis not present

## 2023-07-30 DIAGNOSIS — E1169 Type 2 diabetes mellitus with other specified complication: Secondary | ICD-10-CM | POA: Diagnosis not present

## 2023-07-30 DIAGNOSIS — I214 Non-ST elevation (NSTEMI) myocardial infarction: Secondary | ICD-10-CM | POA: Diagnosis not present

## 2023-07-30 DIAGNOSIS — Z7689 Persons encountering health services in other specified circumstances: Secondary | ICD-10-CM | POA: Diagnosis not present

## 2023-07-30 DIAGNOSIS — Z720 Tobacco use: Secondary | ICD-10-CM | POA: Diagnosis not present

## 2023-07-30 DIAGNOSIS — E785 Hyperlipidemia, unspecified: Secondary | ICD-10-CM | POA: Diagnosis not present

## 2023-07-30 DIAGNOSIS — E119 Type 2 diabetes mellitus without complications: Secondary | ICD-10-CM

## 2023-07-30 DIAGNOSIS — Z5986 Financial insecurity: Secondary | ICD-10-CM

## 2023-07-30 MED ORDER — FLUCONAZOLE 150 MG PO TABS: 150.0000 mg | ORAL_TABLET | Freq: Once | ORAL | 0 refills | Status: AC

## 2023-07-30 NOTE — Progress Notes (Signed)
 Laura Frost     MRN: [id number]      DOB: 11/02/1965  Chief Complaint  Patient presents with   Vaginitis    States she had a bacterial infection about 6 months ago and was given some cream and she states that it never cleared it up and it comes and goes. Bad itch at nighttime, no discharge, no odor    HPI Laura Frost c/o severe vaginal itching since March , 3 episodes per week on avg, white scaly material in vulva noticed after a bath Lagging  in all [place] and states she has not got money to buy her maintainace meds and is taking nothing  ROS Denies recent fever or chills. Denies sinus pressure, nasal congestion, ear pain or sore throat. Denies chest congestion, productive cough or wheezing. Denies chest pains, palpitations and leg swelling Denies abdominal pain, nausea, vomiting,diarrhea or constipation.   . Denies joint pain, swelling and limitation in mobility. Denies headaches, seizures, numbness, or tingling. Denies depression, anxiety or insomnia. Denies skin break down or rash.   PE  BP 121/79   Pulse 82   Resp 16   Ht 5' (1.524 m)   Wt 159 lb (72.1 kg)   SpO2 98%   BMI 31.05 kg/m   Patient alert and oriented and in no cardiopulmonary distress.  HEENT: No facial asymmetry, EOMI,     Neck supple .  Chest: Clear to auscultation bilaterally.  CVS: S1, S2 no murmurs, no S3.Regular rate.  ABD: Soft non tender.   Ext: No edema  MS: Adequate ROM spine, shoulders, hips and knees.  Skin: Intact, no ulcerations or rash noted.  Psych: Good eye contact, normal affect. Memory intact not anxious or depressed appearing.  CNS: CN 2-12 intact, power,  normal throughout.no focal deficits noted.   Assessment & Plan  Tobacco abuse Asked:confirms currently smokes cigarettes Assess: Unwilling to set a quit date,  Advise: needs to QUIT to reduce risk of cancer, cardio and cerebrovascular disease Assist: counseled for 5 minutes and literature provided Arrange: follow  up in 2 to 4 months   Vaginal itching Persistent and recurrent, test for all STD's fluconazole 150 mg prescribed pending results based on chief complaint  DM type 2 (diabetes mellitus, type 2) (HCC) Diabetes associated with hypertension, hyperlipidemia, and heart disease  Laura Frost is reminded of the importance of commitment to daily physical activity for 30 minutes or more, as able and the need to limit carbohydrate intake to 30 to 60 grams per meal to help with blood sugar control.   The need to take medication as prescribed, test blood sugar as directed, and to call between visits if there is a concern that blood sugar is uncontrolled is also discussed.   Laura Frost is reminded of the importance of daily foot exam, annual eye examination, and good blood sugar, blood pressure and cholesterol control. UNCONTROLLED and non compliant , refer [facility], close f/u with PCP     Latest Ref Rng & Units 02/26/2023    9:24 AM 10/30/2022   12:33 AM 10/29/2022   10:03 AM 03/11/2022    5:39 AM 03/10/2022   11:42 AM  Diabetic Labs  HbA1c 4.8 - 5.6 % 11.3  9.3      Micro/Creat Ratio 0 - 29 mg/g creat 7       Chol 100 - 199 mg/dL 409  811  914  782    HDL >39 mg/dL 52  44  62  42  Calc LDL 0 - 99 mg/dL 782  UNABLE TO CALCULATE IF TRIGLYCERIDE OVER 400 mg/dL  956  62    Triglycerides 0 - 149 mg/dL 213  086  578  469    Creatinine 0.57 - 1.00 mg/dL 6.29  5.28  4.13  2.44  0.53       07/30/2023    4:06 PM 04/08/2023   11:24 AM 04/08/2023   11:15 AM 02/26/2023    8:40 AM 02/26/2023    8:09 AM 10/30/2022    9:56 AM 10/30/2022    6:21 AM  BP/Weight  Systolic BP 121 147 171 140 153 164   Diastolic BP 79 98 98 57 88 88   Wt. (Lbs) 159  164.08  166.12  171.7  BMI 31.05 kg/m2  32.04 kg/m2  32.44 kg/m2  28.57 kg/m2      02/26/2023    8:00 AM  Foot/eye exam completion dates  Foot Form Completion Done      Updated lab needed at/ before next visit.   Hypertension Controlleda at visit though  she denies taking meds as states unable to afford, will f/u with PCP  NSTEMI (non-ST elevated myocardial infarction) (HCC) Neds to  take appropriate meds and quit snoking, needs case worker as financial insecurity a major challenge per her report Denies chest pain or exertional fatigue  Hyperlipidemia LDL goal <70 Hyperlipidemia:Low fat diet discussed and encouraged.   Lipid Panel  Lab Results  Component Value Date   CHOL 251 (H) 02/26/2023   HDL 52 02/26/2023   LDLCALC 161 (H) 02/26/2023   LDLDIRECT 128 (H) 10/30/2022   TRIG 205 (H) 02/26/2023   CHOLHDL 4.8 (H) 02/26/2023     Updated lab needed at/ before next visit.

## 2023-07-30 NOTE — Patient Instructions (Addendum)
 F/U with PCP in 1to 2 weeks  Wet prep , gC and chlamydia  HSV , hepatitis screen, ABC  Needs referral to [facility] as finacial strain   cBC, lipid, cmp and eGFR, hBA1C, HSV 2 , tSH in am  Fluconazole 1 tab is prescribed  Thanks for choosing [facility], we consider it a privelige to serve you.

## 2023-07-30 NOTE — Telephone Encounter (Signed)
 Patient advised to follow up with PCP in 1-2 wks- nothing available please advise on scheduling.  Thanks

## 2023-07-31 NOTE — Telephone Encounter (Signed)
Please schedule next available slot 

## 2023-08-01 NOTE — Telephone Encounter (Signed)
 Appt scheduled , mailed patient reminder

## 2023-08-02 MED ORDER — METRONIDAZOLE 500 MG PO TABS: 500.0000 mg | ORAL_TABLET | Freq: Two times a day (BID) | ORAL | 0 refills | Status: DC

## 2023-08-03 ENCOUNTER — Encounter: Payer: Self-pay | Admitting: Family Medicine

## 2023-08-03 DIAGNOSIS — E785 Hyperlipidemia, unspecified: Secondary | ICD-10-CM | POA: Insufficient documentation

## 2023-08-03 LAB — NUSWAB VAGINITIS PLUS (VG+)
Atopobium vaginae: HIGH {score} — AB
BVAB 2: HIGH {score} — AB
Candida albicans, NAA: NEGATIVE
Candida glabrata, NAA: NEGATIVE
Megasphaera 1: HIGH {score} — AB

## 2023-08-03 NOTE — Assessment & Plan Note (Signed)
 Diabetes associated with hypertension, hyperlipidemia, and heart disease  Laura Frost is reminded of the importance of commitment to daily physical activity for 30 minutes or more, as able and the need to limit carbohydrate intake to 30 to 60 grams per meal to help with blood sugar control.   The need to take medication as prescribed, test blood sugar as directed, and to call between visits if there is a concern that blood sugar is uncontrolled is also discussed.   Laura Frost is reminded of the importance of daily foot exam, annual eye examination, and good blood sugar, blood pressure and cholesterol control. UNCONTROLLED and non compliant , refer [facility], close f/u with PCP     Latest Ref Rng & Units 02/26/2023    9:24 AM 10/30/2022   12:33 AM 10/29/2022   10:03 AM 03/11/2022    5:39 AM 03/10/2022   11:42 AM  Diabetic Labs  HbA1c 4.8 - 5.6 % 11.3  9.3      Micro/Creat Ratio 0 - 29 mg/g creat 7       Chol 100 - 199 mg/dL 147  829  562  130    HDL >39 mg/dL 52  44  62  42    Calc LDL 0 - 99 mg/dL 865  UNABLE TO CALCULATE IF TRIGLYCERIDE OVER 400 mg/dL  784  62    Triglycerides 0 - 149 mg/dL 696  295  284  132    Creatinine 0.57 - 1.00 mg/dL 4.40  1.02  7.25  3.66  0.53       07/30/2023    4:06 PM 04/08/2023   11:24 AM 04/08/2023   11:15 AM 02/26/2023    8:40 AM 02/26/2023    8:09 AM 10/30/2022    9:56 AM 10/30/2022    6:21 AM  BP/Weight  Systolic BP 121 147 171 140 153 164   Diastolic BP 79 98 98 57 88 88   Wt. (Lbs) 159  164.08  166.12  171.7  BMI 31.05 kg/m2  32.04 kg/m2  32.44 kg/m2  28.57 kg/m2      02/26/2023    8:00 AM  Foot/eye exam completion dates  Foot Form Completion Done      Updated lab needed at/ before next visit.

## 2023-08-03 NOTE — Assessment & Plan Note (Signed)
 Persistent and recurrent, test for all STD's fluconazole 150 mg prescribed pending results based on chief complaint

## 2023-08-03 NOTE — Assessment & Plan Note (Signed)
 Asked:confirms currently smokes cigarettes Assess: Unwilling to set a quit date,  Advise: needs to QUIT to reduce risk of cancer, cardio and cerebrovascular disease Assist: counseled for 5 minutes and literature provided Arrange: follow up in 2 to 4 months

## 2023-08-03 NOTE — Assessment & Plan Note (Signed)
 Controlleda at visit though she denies taking meds as states unable to afford, will f/u with PCP

## 2023-08-03 NOTE — Assessment & Plan Note (Signed)
 Neds to  take appropriate meds and quit snoking, needs case worker as financial insecurity a major challenge per her report Denies chest pain or exertional fatigue

## 2023-08-03 NOTE — Assessment & Plan Note (Signed)
 Hyperlipidemia:Low fat diet discussed and encouraged.   Lipid Panel  Lab Results  Component Value Date   CHOL 251 (H) 02/26/2023   HDL 52 02/26/2023   LDLCALC 161 (H) 02/26/2023   LDLDIRECT 128 (H) 10/30/2022   TRIG 205 (H) 02/26/2023   CHOLHDL 4.8 (H) 02/26/2023     Updated lab needed at/ before next visit.

## 2023-08-06 ENCOUNTER — Telehealth: Payer: Self-pay

## 2023-08-06 NOTE — Progress Notes (Signed)
 Care Guide Pharmacy Note  08/06/2023 Name: Laura Frost MRN: [id number] DOB: Apr 19, 1966  Referred By: Rica Records, FNP Reason for referral: Care Coordination (Outreach to schedule referral )   Laura Frost is a 58 y.o. year old female who is a primary care patient of Del Nigel Berthold, FNP.  Laura Frost was referred to the pharmacist for assistance related to: HTN and DMII  An unsuccessful telephone outreach was attempted today to contact the patient who was referred to the pharmacy team for assistance with medication assistance. Additional attempts will be made to contact the patient.  Penne Lash , RMA     [facility]  [facility], [facility] Guide  Direct Dial: [phone]  Website: [email or web address]

## 2023-08-06 NOTE — Progress Notes (Signed)
 Complex Care Management Note Care Guide Note  08/06/2023 Name: Laura Frost MRN: [id number] DOB: 04-05-1966   Complex Care Management Outreach Attempts: An unsuccessful telephone outreach was attempted today to offer the patient information about available complex care management services.  Follow Up Plan:  Additional outreach attempts will be made to offer the patient complex care management information and services.   Encounter Outcome:  No Answer  Penne Lash , RMA       [facility], [facility] Guide  Direct Dial: [phone]  Website: [email or web address]

## 2023-08-08 NOTE — Telephone Encounter (Signed)
 Copied from CRM [id number]. Topic: Clinical - Lab/Test Results >> Aug 07, 2023  4:20 PM Ivette P wrote: Reason for CRM: Pt called in to follow up on results on 03/19, pt states she has yet to receive a call. Checked and there no notes regarding results to relay to pt . Pt would like for someone call her Number [phone]

## 2023-08-08 NOTE — Telephone Encounter (Signed)
 Patient was seen by on Dr. Lodema Hong 3/19

## 2023-08-11 NOTE — Progress Notes (Signed)
 Complex Care Management Note Care Guide Note  08/11/2023 Name: Laura Frost MRN: [id number] DOB: [birth year]   Complex Care Management Outreach Attempts: A second unsuccessful outreach was attempted today to offer the patient with information about available complex care management services.  Follow Up Plan:  Additional outreach attempts will be made to offer the patient complex care management information and services.   Encounter Outcome:  No Answer  Penne Lash , RMA     [place]  [facility], [facility] Guide  Direct Dial: [phone]  Website: [email or web address]

## 2023-08-12 ENCOUNTER — Telehealth: Payer: Self-pay

## 2023-08-12 NOTE — Telephone Encounter (Signed)
 Attempted to reach patient, unable to speak to her

## 2023-08-12 NOTE — Telephone Encounter (Signed)
 Copied from CRM [id number]. Topic: Clinical - Medication Question >> Aug 11, 2023  1:46 PM Fredrich Romans wrote: Reason for CRM: Patient returned call to office regarding her lab results from her vaginal swab.I relayed message from provider and she wanted to know what "BV" is?She would like a phone call to explain to her

## 2023-08-21 NOTE — Progress Notes (Signed)
 Complex Care Management Note Care Guide Note  08/21/2023 Name: Laura Frost MRN: [id number] DOB: Nov 03, 1965   Complex Care Management Outreach Attempts: A third unsuccessful outreach was attempted today to offer the patient with information about available complex care management services.  Follow Up Plan:  No further outreach attempts will be made at this time. We have been unable to contact the patient to offer or enroll patient in complex care management services.  Encounter Outcome:  No Answer  Penne Lash , RMA     [place]  [facility], [facility] Guide  Direct Dial: [phone]  Website: [email or web address]

## 2023-08-23 DIAGNOSIS — Z419 Encounter for procedure for purposes other than remedying health state, unspecified: Secondary | ICD-10-CM | POA: Diagnosis not present

## 2023-08-27 ENCOUNTER — Encounter (INDEPENDENT_AMBULATORY_CARE_PROVIDER_SITE_OTHER): Payer: Self-pay | Admitting: *Deleted

## 2023-09-22 DIAGNOSIS — Z419 Encounter for procedure for purposes other than remedying health state, unspecified: Secondary | ICD-10-CM | POA: Diagnosis not present

## 2023-09-25 ENCOUNTER — Telehealth: Payer: Self-pay

## 2023-09-25 ENCOUNTER — Other Ambulatory Visit: Payer: Self-pay

## 2023-09-25 MED ORDER — METRONIDAZOLE 500 MG PO TABS: 500.0000 mg | ORAL_TABLET | Freq: Two times a day (BID) | ORAL | 0 refills | Status: DC

## 2023-09-25 NOTE — Telephone Encounter (Signed)
 That's fine

## 2023-09-25 NOTE — Telephone Encounter (Signed)
 "  Call cannot be completed as dialed"

## 2023-09-25 NOTE — Telephone Encounter (Signed)
 Med refilled. Tried to call pt but number just rings then says "call cannot be completed as dialed"

## 2023-09-25 NOTE — Telephone Encounter (Signed)
 Ok to refill? She couldn't afford it at the time it was first sent in. Pls advise

## 2023-09-25 NOTE — Telephone Encounter (Signed)
 Copied from CRM [id number]. Topic: Clinical - Prescription Issue >> Sep 24, 2023  4:18 PM Stanly Early wrote: Reason for CRM: metroNIDAZOLE  (FLAGYL ) 500 MG tablet patient stated she was not able afford this prescription at the time of 08/04/23. Patient would like the prescription to be put in again.

## 2023-09-26 ENCOUNTER — Ambulatory Visit: Admitting: Family Medicine

## 2023-10-01 ENCOUNTER — Encounter: Payer: Self-pay | Admitting: Family Medicine

## 2023-10-02 ENCOUNTER — Telehealth: Payer: Self-pay

## 2023-10-02 NOTE — Telephone Encounter (Signed)
 Attempted follow up to care connect client who had transitioned to [facility] and established care with [facility]. No answer, states cannot be made as dialed.    Kris Pester RN [employer]

## 2023-10-13 NOTE — Telephone Encounter (Signed)
 Called patient  [phone] to review request and sx for medication flagyl . Reviewed with patient RN has been attempting to Care connect with her due to her transition to medicaid . Patient reports she only had relief of vaginal itching for a couple of days and then sx of vaginal itching and "white foam" noted around vaginal area. Offered appt and patient reports she will need to call back for appt. Please advise if patient can get refill of medication.

## 2023-10-13 NOTE — Telephone Encounter (Unsigned)
 Copied from CRM [id number]. Topic: Clinical - Medication Refill >> Oct 13, 2023 10:22 AM Janeecia G wrote: Medication: metronidazole (flagyl ) 500 MG tablet  Has the patient contacted their pharmacy? No (Agent: If no, request that the patient contact the pharmacy for the refill. If patient does not wish to contact the pharmacy document the reason why and proceed with request.) (Agent: If yes, when and what did the pharmacy advise?)  This is the patient's preferred pharmacy:  [id number] - [place], [place] - [address] AT [facility] [address] [place] [address] Phone: [phone] Fax: [phone]  Is this the correct pharmacy for this prescription? Yes If no, delete pharmacy and type the correct one.   Has the prescription been filled recently? Yes  Is the patient out of the medication? Yes  Has the patient been seen for an appointment in the last year OR does the patient have an upcoming appointment? Yes  Can we respond through MyChart? No  Agent: Please be advised that Rx refills may take up to 3 business days. We ask that you follow-up with your pharmacy.

## 2023-10-13 NOTE — Addendum Note (Signed)
 Addended by: Delmus Ferri on: 10/13/2023 02:04 PM   Modules accepted: Orders

## 2023-10-14 ENCOUNTER — Other Ambulatory Visit: Payer: Self-pay | Admitting: Family Medicine

## 2023-10-14 MED ORDER — METRONIDAZOLE 500 MG PO TABS: 500.0000 mg | ORAL_TABLET | Freq: Two times a day (BID) | ORAL | 0 refills | Status: AC

## 2023-10-14 NOTE — Telephone Encounter (Signed)
 sent

## 2023-10-15 NOTE — Telephone Encounter (Signed)
 Patient advised.

## 2023-10-17 ENCOUNTER — Telehealth: Payer: Self-pay

## 2023-10-17 NOTE — Telephone Encounter (Signed)
 Verbally discussed with provider.  PCP will maintain established care with patient.

## 2023-10-17 NOTE — Telephone Encounter (Signed)
 Patient has missed FOUR appointments in rolling 18-month period. Per no show policy, patient can be considered for dismissal.  Alternatively, registrars can contact patient to reschedule missed appointment and communicate NS policy. Please advise.

## 2023-10-17 NOTE — Telephone Encounter (Signed)
 4! OUCH maybe we can dimiss

## 2023-10-23 DIAGNOSIS — Z419 Encounter for procedure for purposes other than remedying health state, unspecified: Secondary | ICD-10-CM | POA: Diagnosis not present

## 2023-11-03 ENCOUNTER — Ambulatory Visit: Payer: Self-pay

## 2023-11-03 NOTE — Telephone Encounter (Signed)
 FYI Only or Action Required?: FYI only for provider.  Patient was last seen in primary care on 07/30/2023 by Antonetta Rollene BRAVO, MD. Called Nurse Triage reporting Vaginal Itching. Symptoms began several days ago. Interventions attempted: Prescription medications: Flagyl  and Rest, hydration, or home remedies. Symptoms are: unchanged.  Triage Disposition: See Physician Within 24 Hours  Patient/caregiver understands and will follow disposition?: Yes  Copied from CRM [id number]. Topic: Clinical - Red Word Triage >> Nov 03, 2023  8:40 AM Emylou G wrote: [facility] that prompted transfer to Nurse Triage: yeast infection ( medication was metroNIDAZOLE  (FLAGYL ) 500 MG tablet ) not getting better.SABRA scratching really bad ( white foam around area ) Reason for Disposition  MODERATE-SEVERE itching (i.e., interferes with school, work, or sleep)  Answer Assessment - Initial Assessment Questions 1. SYMPTOM: What's the main symptom you're concerned about? (e.g., pain, itching, dryness)     Vaginal itching 2. LOCATION: Where is the  vaginal itching located? (e.g., inside/outside, left/right)     outside 3. ONSET: When did the  vaginal itching  start?     Started within the last several days 4. PAIN: Is there any pain? If Yes, ask: How bad is it? (Scale: 1-10; mild, moderate, severe)   -  MILD (1-3): Doesn't interfere with normal activities.    -  MODERATE (4-7): Interferes with normal activities (e.g., work or school) or awakens from sleep.     -  SEVERE (8-10): Excruciating pain, unable to do any normal activities.     8 out of 10 5. ITCHING: Is there any itching? If Yes, ask: How bad is it? (Scale: 1-10; mild, moderate, severe)     Yes-severe 6. CAUSE: What do you think is causing the discharge? Have you had the same problem before? What happened then?     unsure 7. OTHER SYMPTOMS: Do you have any other symptoms? (e.g., fever, itching, vaginal bleeding, pain with urination, injury to  genital area, vaginal foreign body)     no  Protocols used: Vaginal Symptoms-A-AH

## 2023-11-04 ENCOUNTER — Ambulatory Visit: Payer: Self-pay | Admitting: Internal Medicine

## 2023-11-04 ENCOUNTER — Encounter: Payer: Self-pay | Admitting: Internal Medicine

## 2023-11-04 VITALS — BP 152/84 | HR 83 | Ht 60.0 in | Wt 160.8 lb

## 2023-11-04 DIAGNOSIS — L304 Erythema intertrigo: Secondary | ICD-10-CM | POA: Diagnosis not present

## 2023-11-04 DIAGNOSIS — N760 Acute vaginitis: Secondary | ICD-10-CM

## 2023-11-04 DIAGNOSIS — I10 Essential (primary) hypertension: Secondary | ICD-10-CM

## 2023-11-04 DIAGNOSIS — E1165 Type 2 diabetes mellitus with hyperglycemia: Secondary | ICD-10-CM

## 2023-11-04 DIAGNOSIS — Z7984 Long term (current) use of oral hypoglycemic drugs: Secondary | ICD-10-CM | POA: Diagnosis not present

## 2023-11-04 DIAGNOSIS — E785 Hyperlipidemia, unspecified: Secondary | ICD-10-CM | POA: Diagnosis not present

## 2023-11-04 MED ORDER — AMLODIPINE BESYLATE 5 MG PO TABS
5.0000 mg | ORAL_TABLET | Freq: Every day | ORAL | 2 refills | Status: AC
Start: 2023-11-04 — End: ?

## 2023-11-04 MED ORDER — METOPROLOL SUCCINATE ER 25 MG PO TB24: 12.5000 mg | ORAL_TABLET | Freq: Every day | ORAL | 2 refills | Status: AC

## 2023-11-04 MED ORDER — FLUCONAZOLE 150 MG PO TABS
150.0000 mg | ORAL_TABLET | Freq: Once | ORAL | 0 refills | Status: AC
Start: 2023-11-04 — End: 2023-11-04

## 2023-11-04 MED ORDER — GLIPIZIDE 10 MG PO TABS: 10.0000 mg | ORAL_TABLET | Freq: Every day | ORAL | 3 refills | Status: AC

## 2023-11-04 MED ORDER — CLOTRIMAZOLE-BETAMETHASONE 1-0.05 % EX CREA: 1.0000 | TOPICAL_CREAM | Freq: Every day | CUTANEOUS | 0 refills | Status: AC

## 2023-11-04 MED ORDER — METFORMIN HCL 1000 MG PO TABS: 1000.0000 mg | ORAL_TABLET | Freq: Two times a day (BID) | ORAL | 3 refills | Status: AC

## 2023-11-04 MED ORDER — ATORVASTATIN CALCIUM 80 MG PO TABS: 80.0000 mg | ORAL_TABLET | Freq: Every day | ORAL | 2 refills | Status: AC

## 2023-11-04 MED ORDER — LISINOPRIL 10 MG PO TABS: 10.0000 mg | ORAL_TABLET | Freq: Every day | ORAL | 3 refills | Status: AC

## 2023-11-04 NOTE — Assessment & Plan Note (Signed)
 Perineal area rash and itching likely due to candidal vaginitis, especially in the setting of uncontrolled type II DM Empiric fluconazole  prescribed Lotrisone cream for local itching/irritation Explained relationship of uncontrolled type II DM and recurrent vaginitis, she expressed understanding of it and agrees to take her medications regularly

## 2023-11-04 NOTE — Assessment & Plan Note (Signed)
 Lab Results  Component Value Date   HGBA1C 11.3 (H) 02/26/2023   Uncontrolled due to noncompliance to diet and medications Restart metformin  1000 mg twice daily and glipizide  10 mg twice daily Discussed about Ozempic , but she prefers to wait for now - will defer to PCP Advised to follow diabetic diet On statin and ACEi, refilled today F/u CMP and HbA1c

## 2023-11-04 NOTE — Patient Instructions (Addendum)
 Please take Fluconazole  for yeast infection.  Please apply Lotrisone cream over perineal area.  Please start taking Metformin  and Glipizide  for diabetes.  Please start taking Amlodipine , Lisinopril  and Metoprolol  for blood pressure.

## 2023-11-04 NOTE — Progress Notes (Signed)
 Acute Office Visit  Subjective:    Patient ID: Laura Frost, female    DOB: 1965-07-25, 58 y.o.   MRN: [id number]  Chief Complaint  Patient presents with   Vaginal Itching    HPI Patient is in today for complaint of recurrent vaginal itching/irritation and perineal area rash for the last 3 months.  She has had vaginal swab in 03/25, which showed BV, was treated with metronidazole  x 2.  Of note, she has history of uncontrolled diabetes melitis, and has stopped taking all of her medications currently.  She was placed on metformin  1000 mg twice daily, glipizide  10 mg twice daily and Ozempic  in the past, but has not been taking them.  She does not check her blood glucose regularly, but reports that she has had HI (unreadable) reading on her meter at times.  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   Cervical cancer (HCC)    Hypertension    Type 2 diabetes mellitus (HCC)     Past Surgical History:  Procedure Laterality Date   LEFT HEART CATH AND CORONARY ANGIOGRAPHY N/A 02/15/2021   Procedure: LEFT HEART CATH AND CORONARY ANGIOGRAPHY;  Surgeon: Court Dorn PARAS, MD;  Location: [facility] INVASIVE CV LAB;  Service: Cardiovascular;  Laterality: N/A;   LEFT HEART CATH AND CORONARY ANGIOGRAPHY N/A 10/29/2022   Procedure: LEFT HEART CATH AND CORONARY ANGIOGRAPHY;  Surgeon: Dann Candyce RAMAN, MD;  Location: [facility] INVASIVE CV LAB;  Service: Cardiovascular;  Laterality: N/A;   OVARY SURGERY      Family History  Problem Relation Age of Onset   Heart attack Father    Liver disease Sister     Social History   Socioeconomic History   Marital status: Single    Spouse name: Not on file   Number of children: Not on file   Years of education: Not on file   Highest education level: Not on file  Occupational History   Not on file  Tobacco Use   Smoking status: Every Day    Current packs/day: 0.50     Types: Cigarettes   Smokeless tobacco: Never  Vaping Use   Vaping status: Never Used  Substance and Sexual Activity   Alcohol use: Yes    Comment: Occasional   Drug use: No   Sexual activity: Yes    Birth control/protection: None  Other Topics Concern   Not on file  Social History Narrative   ** Merged History Encounter **       Social Drivers of Health   Financial Resource Strain: Not on file  Food Insecurity: Food Insecurity Present (03/10/2022)   Hunger Vital Sign    Worried About Running Out of Food in the Last Year: Sometimes true    Ran Out of Food in the Last Year: Sometimes true  Transportation Needs: No Transportation Needs (03/10/2022)   PRAPARE - Administrator, Civil Service (Medical): No    Lack of Transportation (Non-Medical): No  Physical Activity: Not on file  Stress: Not on file  Social Connections: Not on file  Intimate Partner Violence: Not At Risk (03/10/2022)   Humiliation, Afraid, Rape, and Kick questionnaire    Fear of Current or Ex-Partner: No    Emotionally Abused: No    Physically Abused: No    Sexually Abused: No    Outpatient Medications Prior to Visit  Medication Sig Dispense Refill  albuterol  (VENTOLIN  HFA) 108 (90 Base) MCG/ACT inhaler Inhale 1-2 puffs into the lungs every 6 (six) hours as needed for wheezing or shortness of breath. 8 g 5   Cholecalciferol (VITAMIN D3) 25 MCG (1000 UT) CAPS Take 1 capsule (1,000 Units total) by mouth daily. (Patient not taking: Reported on 04/08/2023) 90 capsule 2   clopidogrel  (PLAVIX ) 75 MG tablet Take 1 tablet (75 mg total) by mouth daily. 90 tablet 3   Continuous Glucose Sensor (DEXCOM G7 SENSOR) MISC Use a directed (Patient not taking: Reported on 04/08/2023) 1 each 1   hydrOXYzine  (VISTARIL ) 25 MG capsule Take 1 capsule (25 mg total) by mouth every 8 (eight) hours as needed for itching. 30 capsule 1   metroNIDAZOLE  (FLAGYL ) 500 MG tablet Take 1 tablet (500 mg total) by mouth 2 (two) times  daily. 14 tablet 0   nitroGLYCERIN  (NITROSTAT ) 0.4 MG SL tablet Place 1 tablet (0.4 mg total) under the tongue every 5 (five) minutes x 3 doses as needed for chest pain. (Patient not taking: Reported on 04/08/2023) 25 tablet 3   omega-3 fish oil  (MAXEPA) 1000 MG CAPS capsule Take 2 capsules (2,000 mg total) by mouth daily. (Patient not taking: Reported on 07/30/2023) 90 capsule 3   Semaglutide ,0.25 or 0.5MG /DOS, (OZEMPIC , 0.25 OR 0.5 MG/DOSE,) 2 MG/3ML SOPN Inject 0.25 mg into the skin once a week. (Patient not taking: Reported on 07/30/2023) 3 mL 0   amLODipine  (NORVASC ) 5 MG tablet Take 1 tablet (5 mg total) by mouth daily. (Patient not taking: Reported on 07/30/2023) 30 tablet 2   atorvastatin  (LIPITOR ) 80 MG tablet Take 1 tablet (80 mg total) by mouth daily. 90 tablet 2   glipiZIDE  (GLUCOTROL ) 10 MG tablet Take 1 tablet (10 mg total) by mouth daily before breakfast. 60 tablet 3   lisinopril  (ZESTRIL ) 10 MG tablet Take 1 tablet (10 mg total) by mouth daily. 30 tablet 3   metFORMIN  (GLUCOPHAGE ) 1000 MG tablet Take 1 tablet (1,000 mg total) by mouth 2 (two) times daily with a meal. (Patient not taking: Reported on 07/30/2023) 180 tablet 3   metoprolol  succinate (TOPROL -XL) 25 MG 24 hr tablet Take 0.5 tablets (12.5 mg total) by mouth daily. 30 tablet 2   No facility-administered medications prior to visit.    Allergies  Allergen Reactions   Asa [Aspirin ]     Told as child she was allergic    Review of Systems  Constitutional:  Positive for fatigue. Negative for chills and fever.  HENT:  Negative for congestion, sinus pressure, sinus pain and sore throat.   Eyes:  Negative for pain and discharge.  Respiratory:  Negative for cough and shortness of breath.   Cardiovascular:  Negative for chest pain and palpitations.  Gastrointestinal:  Negative for abdominal pain, diarrhea, nausea and vomiting.  Endocrine: Negative for polydipsia and polyuria.  Genitourinary:  Positive for vaginal pain. Negative  for dysuria and hematuria.       Perineal area itching  Musculoskeletal:  Negative for neck pain and neck stiffness.  Skin:  Positive for rash.  Neurological:  Negative for dizziness and weakness.  Psychiatric/Behavioral:  Negative for agitation and behavioral problems.        Objective:    Physical Exam Vitals reviewed.  Constitutional:      General: She is not in acute distress.    Appearance: She is not diaphoretic.  HENT:     Head: Normocephalic and atraumatic.     Nose: Nose normal.     Mouth/Throat:  Mouth: Mucous membranes are moist.   Eyes:     General: No scleral icterus.    Extraocular Movements: Extraocular movements intact.    Cardiovascular:     Rate and Rhythm: Normal rate and regular rhythm.     Heart sounds: Normal heart sounds. No murmur heard. Pulmonary:     Breath sounds: Normal breath sounds. No wheezing or rales.   Musculoskeletal:     Cervical back: Neck supple. No tenderness.     Right lower leg: No edema.     Left lower leg: No edema.   Skin:    General: Skin is warm.     Findings: Rash (Patient reported erythematous rash in perineal area) present.   Neurological:     General: No focal deficit present.     Mental Status: She is alert and oriented to person, place, and time.   Psychiatric:        Mood and Affect: Mood normal.        Behavior: Behavior normal.     BP (!) 152/84 (BP Location: Left Arm)   Pulse 83   Ht 5' (1.524 m)   Wt 160 lb 12.8 oz (72.9 kg)   SpO2 98%   BMI 31.40 kg/m  Wt Readings from Last 3 Encounters:  11/04/23 160 lb 12.8 oz (72.9 kg)  07/30/23 159 lb (72.1 kg)  04/08/23 164 lb 1.3 oz (74.4 kg)        Assessment & Plan:   Problem List Items Addressed This Visit       Cardiovascular and Mediastinum   Hypertension   BP Readings from Last 1 Encounters:  11/04/23 (!) 150/80   Uncontrolled due to noncompliance Restart lisinopril  10 mg QD, amlodipine  5 mg QD and metoprolol  12.5 mg QD Counseled for  compliance with the medications Advised DASH diet and moderate exercise/walking, at least 150 mins/week       Relevant Medications   amLODipine  (NORVASC ) 5 MG tablet   atorvastatin  (LIPITOR ) 80 MG tablet   lisinopril  (ZESTRIL ) 10 MG tablet   metoprolol  succinate (TOPROL -XL) 25 MG 24 hr tablet     Endocrine   DM type 2 (diabetes mellitus, type 2) (HCC)   Lab Results  Component Value Date   HGBA1C 11.3 (H) 02/26/2023   Uncontrolled due to noncompliance to diet and medications Restart metformin  1000 mg twice daily and glipizide  10 mg twice daily Discussed about Ozempic , but she prefers to wait for now - will defer to PCP Advised to follow diabetic diet On statin and ACEi, refilled today F/u CMP and HbA1c      Relevant Medications   atorvastatin  (LIPITOR ) 80 MG tablet   glipiZIDE  (GLUCOTROL ) 10 MG tablet   lisinopril  (ZESTRIL ) 10 MG tablet   metFORMIN  (GLUCOPHAGE ) 1000 MG tablet   Other Relevant Orders   CMP14+EGFR   Hemoglobin A1c     Genitourinary   Acute vaginitis - Primary   Perineal area rash and itching likely due to candidal vaginitis, especially in the setting of uncontrolled type II DM Empiric fluconazole  prescribed Lotrisone cream for local itching/irritation Explained relationship of uncontrolled type II DM and recurrent vaginitis, she expressed understanding of it and agrees to take her medications regularly      Relevant Medications   fluconazole  (DIFLUCAN ) 150 MG tablet   Other Relevant Orders   NuSwab Vaginitis (VG)     Other   Hyperlipidemia LDL goal <70   Refilled atorvastatin  80 mg QD, needs to remain compliant  Relevant Medications   amLODipine  (NORVASC ) 5 MG tablet   atorvastatin  (LIPITOR ) 80 MG tablet   lisinopril  (ZESTRIL ) 10 MG tablet   metoprolol  succinate (TOPROL -XL) 25 MG 24 hr tablet   Other Visit Diagnoses       Intertrigo       Relevant Medications   clotrimazole-betamethasone (LOTRISONE) cream        Meds ordered this  encounter  Medications   fluconazole  (DIFLUCAN ) 150 MG tablet    Sig: Take 1 tablet (150 mg total) by mouth once for 1 dose.    Dispense:  1 tablet    Refill:  0   clotrimazole-betamethasone (LOTRISONE) cream    Sig: Apply 1 Application topically daily.    Dispense:  30 g    Refill:  0   amLODipine  (NORVASC ) 5 MG tablet    Sig: Take 1 tablet (5 mg total) by mouth daily.    Dispense:  30 tablet    Refill:  2   atorvastatin  (LIPITOR ) 80 MG tablet    Sig: Take 1 tablet (80 mg total) by mouth daily.    Dispense:  90 tablet    Refill:  2   glipiZIDE  (GLUCOTROL ) 10 MG tablet    Sig: Take 1 tablet (10 mg total) by mouth daily before breakfast.    Dispense:  60 tablet    Refill:  3   lisinopril  (ZESTRIL ) 10 MG tablet    Sig: Take 1 tablet (10 mg total) by mouth daily.    Dispense:  30 tablet    Refill:  3   metFORMIN  (GLUCOPHAGE ) 1000 MG tablet    Sig: Take 1 tablet (1,000 mg total) by mouth 2 (two) times daily with a meal.    Dispense:  180 tablet    Refill:  3   metoprolol  succinate (TOPROL -XL) 25 MG 24 hr tablet    Sig: Take 0.5 tablets (12.5 mg total) by mouth daily.    Dispense:  30 tablet    Refill:  2     Tejuan Gholson MARLA Blanch, MD

## 2023-11-04 NOTE — Assessment & Plan Note (Signed)
 BP Readings from Last 1 Encounters:  11/04/23 (!) 150/80   Uncontrolled due to noncompliance Restart lisinopril  10 mg QD, amlodipine  5 mg QD and metoprolol  12.5 mg QD Counseled for compliance with the medications Advised DASH diet and moderate exercise/walking, at least 150 mins/week

## 2023-11-04 NOTE — Assessment & Plan Note (Signed)
 Refilled atorvastatin  80 mg QD, needs to remain compliant

## 2023-11-06 ENCOUNTER — Ambulatory Visit: Payer: Self-pay | Admitting: Internal Medicine

## 2023-11-06 LAB — NUSWAB VAGINITIS (VG)

## 2023-11-06 MED ORDER — METRONIDAZOLE 500 MG PO TABS: 500.0000 mg | ORAL_TABLET | Freq: Two times a day (BID) | ORAL | 0 refills | Status: AC

## 2023-11-06 NOTE — Addendum Note (Signed)
 Addended byBETHA TOBIE DOWNS on: 11/06/2023 07:59 AM   Modules accepted: Orders

## 2023-11-07 LAB — NUSWAB VAGINITIS (VG)
Atopobium vaginae: HIGH {score} — AB
BVAB 2: HIGH {score} — AB
Megasphaera 1: HIGH {score} — AB

## 2023-11-13 ENCOUNTER — Ambulatory Visit

## 2023-11-22 DIAGNOSIS — Z419 Encounter for procedure for purposes other than remedying health state, unspecified: Secondary | ICD-10-CM | POA: Diagnosis not present

## 2023-11-28 ENCOUNTER — Ambulatory Visit: Admitting: Family Medicine

## 2023-12-03 ENCOUNTER — Encounter: Payer: Self-pay | Admitting: Family Medicine

## 2023-12-08 NOTE — Telephone Encounter (Signed)
 Patient has missed FIVE appointments in rolling 10-month period. Per no show policy, patient can be considered for dismissal.   Alternatively, registrars can contact patient to reschedule missed appointment and communicate NS policy. Please advise.

## 2023-12-09 NOTE — Telephone Encounter (Signed)
 Registrar can contact patient to reschedule missed appointment and communicate NS policy.

## 2023-12-10 NOTE — Telephone Encounter (Signed)
 Rescheduled

## 2023-12-23 DIAGNOSIS — Z419 Encounter for procedure for purposes other than remedying health state, unspecified: Secondary | ICD-10-CM | POA: Diagnosis not present

## 2023-12-25 ENCOUNTER — Ambulatory Visit: Admitting: Family Medicine

## 2023-12-30 ENCOUNTER — Encounter: Payer: Self-pay | Admitting: Family Medicine

## 2024-01-07 NOTE — Telephone Encounter (Signed)
 Patient has missed SIX appointments in rolling 28-month period. Per no show policy, patient can be considered for dismissal.   Alternatively, registrars can contact patient to reschedule missed appointment and communicate NS policy. Please advise.

## 2024-01-07 NOTE — Telephone Encounter (Signed)
 We can dismiss this time. Too many missed appointments

## 2024-01-13 ENCOUNTER — Encounter: Payer: Self-pay | Admitting: Family Medicine

## 2024-01-19 ENCOUNTER — Telehealth: Payer: Self-pay

## 2024-01-19 NOTE — Telephone Encounter (Signed)
 received call from former [facility] client who has transitioned to [facility]. she admits she has established with [facility],  but has not been attending appointments. states she will call and schedule an appointment. discussed importance of ongoing medical care. she states she has her medications. she is looking for dental services. She inquired if she could use [facility] for that service and discussed no due to Medicaid, suggested she try [employer] on [address] in [place] and also call the customer service number on her card for further help. she states she will call [place] and Medicaid if needed. Again, expressed need to maintain follow ups with her primary care for health maintenance and to assure not running out of medications.   Avelina JONELLE Skeen RN [employer]

## 2024-01-23 DIAGNOSIS — Z419 Encounter for procedure for purposes other than remedying health state, unspecified: Secondary | ICD-10-CM | POA: Diagnosis not present

## 2024-02-05 ENCOUNTER — Telehealth: Payer: Self-pay

## 2024-02-05 NOTE — Telephone Encounter (Signed)
 Returned call to former [employer] client asking about food market availability. Discussed the food market is open and she is able to shop today if needed.   Avelina JONELLE Skeen RN [facility]

## 2024-04-19 ENCOUNTER — Telehealth: Payer: Self-pay | Admitting: Pharmacy Technician

## 2024-04-19 ENCOUNTER — Other Ambulatory Visit (HOSPITAL_COMMUNITY): Payer: Self-pay

## 2024-04-19 NOTE — Telephone Encounter (Signed)
 Pharmacy Patient Advocate Encounter   Received notification from CoverMyMeds that prior authorization for Ozempic  is due for renewal.   Insurance verification completed.   The patient is insured through [facility] MEDICAID.  Action: Medication has been discontinued. Archived Key: [id number]  Patient reported as not taking. Dispense history has no history of this medication ever being filled.

## 2024-04-23 DIAGNOSIS — Z419 Encounter for procedure for purposes other than remedying health state, unspecified: Secondary | ICD-10-CM | POA: Diagnosis not present

## 2024-05-09 ENCOUNTER — Emergency Department (HOSPITAL_COMMUNITY)
Admission: EM | Admit: 2024-05-09 | Discharge: 2024-05-09 | Disposition: A | Discharge status: Home / Self Care | Attending: Emergency Medicine | Admitting: Emergency Medicine

## 2024-05-09 ENCOUNTER — Other Ambulatory Visit: Payer: Self-pay

## 2024-05-09 ENCOUNTER — Encounter (HOSPITAL_COMMUNITY): Payer: Self-pay

## 2024-05-09 DIAGNOSIS — K047 Periapical abscess without sinus: Secondary | ICD-10-CM | POA: Insufficient documentation

## 2024-05-09 DIAGNOSIS — K0889 Other specified disorders of teeth and supporting structures: Secondary | ICD-10-CM | POA: Diagnosis present

## 2024-05-09 MED ORDER — IBUPROFEN 600 MG PO TABS: 600.0000 mg | ORAL_TABLET | Freq: Three times a day (TID) | ORAL | 0 refills | Status: AC

## 2024-05-09 MED ORDER — HYDROCODONE-ACETAMINOPHEN 5-325 MG PO TABS: 1.0000 | ORAL_TABLET | Freq: Four times a day (QID) | ORAL | 0 refills | Status: AC | PRN

## 2024-05-09 MED ORDER — AMOXICILLIN 500 MG PO CAPS: 500.0000 mg | ORAL_CAPSULE | Freq: Three times a day (TID) | ORAL | 0 refills | Status: AC

## 2024-05-09 MED ORDER — AMOXICILLIN 250 MG PO CAPS
500.0000 mg | ORAL_CAPSULE | Freq: Once | ORAL | Status: AC
  Administered 2024-05-09: 500 mg via ORAL
  Filled 2024-05-09: qty 2

## 2024-05-09 MED ORDER — OXYCODONE-ACETAMINOPHEN 5-325 MG PO TABS
1.0000 | ORAL_TABLET | ORAL | Status: AC | PRN
  Administered 2024-05-09 (×2): 1 via ORAL
  Filled 2024-05-09 (×2): qty 1

## 2024-05-09 NOTE — ED Provider Notes (Signed)
 " [place] EMERGENCY DEPARTMENT AT [facility] Provider Note   CSN: [id number] Arrival date & time: 05/09/24  1141     Patient presents with: Dental Pain   Laura Frost is a 58 y.o. female.   The history is provided by the patient.  Dental Pain Location:  Lower Lower teeth location:  17/LL 3rd molar Quality:  Aching and throbbing Severity:  Severe Onset quality:  Gradual Duration:  2 days Timing:  Constant Progression:  Worsening Chronicity:  Recurrent (Reports several previous episodes of similar symptoms which resolved spontaneously.) Context: abscess and dental caries   Relieved by:  Nothing Worsened by:  Cold food/drink, touching and pressure Ineffective treatments:  Acetaminophen  Associated symptoms: facial pain and gum swelling   Associated symptoms: no facial swelling, no fever, no neck pain, no neck swelling and no trismus        Prior to Admission medications  Medication Sig Start Date End Date Taking? Authorizing Provider  amoxicillin  (AMOXIL ) 500 MG capsule Take 1 capsule (500 mg total) by mouth 3 (three) times daily for 10 days. 05/09/24 05/19/24 Yes Zowie Lundahl, PA-C  HYDROcodone -acetaminophen  (NORCO/VICODIN) 5-325 MG tablet Take 1 tablet by mouth every 6 (six) hours as needed. 05/09/24  Yes Shreyan Hinz, PA-C  ibuprofen  (ADVIL ) 600 MG tablet Take 1 tablet (600 mg total) by mouth 3 (three) times daily. 05/09/24  Yes Meiko Ives, Mliss, PA-C  albuterol  (VENTOLIN  HFA) 108 (90 Base) MCG/ACT inhaler Inhale 1-2 puffs into the lungs every 6 (six) hours as needed for wheezing or shortness of breath. 04/08/23   Del Wilhelmena Lloyd Sola, FNP  amLODipine  (NORVASC ) 5 MG tablet Take 1 tablet (5 mg total) by mouth daily. 11/04/23   Tobie Suzzane POUR, MD  atorvastatin  (LIPITOR ) 80 MG tablet Take 1 tablet (80 mg total) by mouth daily. 11/04/23   Tobie Suzzane POUR, MD  Cholecalciferol (VITAMIN D3) 25 MCG (1000 UT) CAPS Take 1 capsule (1,000 Units total) by mouth daily. Patient  not taking: Reported on 04/08/2023 02/27/23   Del Orbe Polanco, Iliana, FNP  clopidogrel  (PLAVIX ) 75 MG tablet Take 1 tablet (75 mg total) by mouth daily. 02/26/23   Del Orbe Polanco, Iliana, FNP  clotrimazole -betamethasone  (LOTRISONE ) cream Apply 1 Application topically daily. 11/04/23   Tobie Suzzane POUR, MD  Continuous Glucose Sensor (DEXCOM G7 SENSOR) MISC Use a directed Patient not taking: Reported on 04/08/2023 02/26/23   Del Orbe Polanco, Iliana, FNP  glipiZIDE  (GLUCOTROL ) 10 MG tablet Take 1 tablet (10 mg total) by mouth daily before breakfast. 11/04/23   Tobie Suzzane POUR, MD  hydrOXYzine  (VISTARIL ) 25 MG capsule Take 1 capsule (25 mg total) by mouth every 8 (eight) hours as needed for itching. 02/26/23   Del Orbe Polanco, Iliana, FNP  lisinopril  (ZESTRIL ) 10 MG tablet Take 1 tablet (10 mg total) by mouth daily. 11/04/23   Tobie Suzzane POUR, MD  metFORMIN  (GLUCOPHAGE ) 1000 MG tablet Take 1 tablet (1,000 mg total) by mouth 2 (two) times daily with a meal. 11/04/23   Tobie Suzzane POUR, MD  metoprolol  succinate (TOPROL -XL) 25 MG 24 hr tablet Take 0.5 tablets (12.5 mg total) by mouth daily. 11/04/23   Tobie Suzzane POUR, MD  metroNIDAZOLE  (FLAGYL ) 500 MG tablet Take 1 tablet (500 mg total) by mouth 2 (two) times daily. 10/14/23   Del Wilhelmena Lloyd Sola, FNP  nitroGLYCERIN  (NITROSTAT ) 0.4 MG SL tablet Place 1 tablet (0.4 mg total) under the tongue every 5 (five) minutes x 3 doses as needed for chest pain. Patient  not taking: Reported on 04/08/2023 02/26/23   Del Orbe Polanco, Iliana, FNP  omega-3 fish oil  (MAXEPA) 1000 MG CAPS capsule Take 2 capsules (2,000 mg total) by mouth daily. Patient not taking: Reported on 07/30/2023 04/08/23   Del Orbe Polanco, Iliana, FNP  Semaglutide ,0.25 or 0.5MG /DOS, (OZEMPIC , 0.25 OR 0.5 MG/DOSE,) 2 MG/3ML SOPN Inject 0.25 mg into the skin once a week. Patient not taking: Reported on 07/30/2023 05/06/23   Del Wilhelmena Lloyd Sola, FNP    Allergies: Dorethia Lasso ]    Review of  Systems  Constitutional:  Negative for fever.  HENT:  Positive for dental problem. Negative for facial swelling and sore throat.   Respiratory:  Negative for shortness of breath.   Musculoskeletal:  Negative for neck pain and neck stiffness.    Updated Vital Signs BP (!) 156/90   Pulse 88   Temp 98.8 F (37.1 C) (Oral)   Resp 20   Ht 5' (1.524 m)   Wt 74.8 kg   SpO2 99%   BMI 32.22 kg/m   Physical Exam Constitutional:      General: She is not in acute distress.    Appearance: She is well-developed.  HENT:     Head: Normocephalic and atraumatic.     Jaw: No trismus.     Right Ear: Tympanic membrane and external ear normal.     Left Ear: Tympanic membrane and external ear normal.     Mouth/Throat:     Mouth: Mucous membranes are moist. No oral lesions.     Dentition: Dental abscesses present.     Pharynx: Oropharynx is clear. No posterior oropharyngeal erythema.     Comments: 17.  Right lower third molar significant pain with palpation, there is a slight laxity to this tooth which patient endorses has been present for more than a month.  There is some surrounding gingival edema without an obvious drainable abscess, there is no spontaneous drainage, there is no cheek induration or erythema.  No trismus, sublingual space is soft. Eyes:     Conjunctiva/sclera: Conjunctivae normal.  Cardiovascular:     Rate and Rhythm: Normal rate.     Heart sounds: Normal heart sounds.  Pulmonary:     Effort: Pulmonary effort is normal.  Musculoskeletal:        General: Normal range of motion.     Cervical back: Normal range of motion and neck supple.  Lymphadenopathy:     Cervical: No cervical adenopathy.  Skin:    General: Skin is warm and dry.     Findings: No erythema.  Neurological:     Mental Status: She is alert and oriented to person, place, and time.     (all labs ordered are listed, but only abnormal results are displayed) Labs Reviewed - No data to  display  EKG: None  Radiology: No results found.   Procedures   Medications Ordered in the ED  amoxicillin  (AMOXIL ) capsule 500 mg (has no administration in time range)  oxyCODONE -acetaminophen  (PERCOCET/ROXICET) 5-325 MG per tablet 1 tablet (1 tablet Oral Given 05/09/24 1556)                                    Medical Decision Making Patient presented with a simple dental abscess, there is no drainable abscess at this time.  She took a Tylenol  3 this morning with no improvement, she also endorses taking 2 antibiotics 1 last night and 1  this morning given by a friend, she does not know the name of this antibiotic.  There are no concerning symptoms for Ludwig's angina, her vital signs are reassuring, she is afebrile.  She is placed on amoxicillin , she was given ibuprofen  and noted 2-day course of hydrocodone  for pain relief.  She is given referrals to dentistry.  Current precautions were outlined.  Risk Prescription drug management.        Final diagnoses:  Dental abscess    ED Discharge Orders          Ordered    amoxicillin  (AMOXIL ) 500 MG capsule  3 times daily        05/09/24 1608    HYDROcodone -acetaminophen  (NORCO/VICODIN) 5-325 MG tablet  Every 6 hours PRN        05/09/24 1608    ibuprofen  (ADVIL ) 600 MG tablet  3 times daily        05/09/24 1608               Hally Colella, PA-C 05/09/24 1622    Patsey Lot, MD 05/09/24 2332  "

## 2024-05-09 NOTE — Discharge Instructions (Addendum)
 Take the antibiotics until completed.  I have prescribed you some narcotic pain medication to help you for the next 2 days.  After the antibiotic has started to work you should be able to ease off with the pain medication and switch to the ibuprofen  only which is also been prescribed.  Return here for any new or worsening symptoms.   I do recommend following up with a dentist of your choice, I am giving you a list of possible dental choices for dentists that take your insurance.    [facility] dental clinic -  [address] [place] - [place] [address] [phone]  [facility] clinic) treats patients who are in the following groups:  Pediatrics with medicaid up to age 69 Patients over 2 with an orange card and a referral from a medical provider.  - If they are referred - there is a 40$ copay for any service - extration etc.  To get an orange card the following process is necessary.  Call the [facility] to apply for card - [phone] Once approved, you the patient will be set up with a medical provider Medical provider will refer patient to the [facility] clinic .  Offices accepting Medicaid patients:  [facility] - [address] [place], [place] [address] [phone]  [facility] - [address] [place] [address] [phone]  [facility] (will see emergency dental patients from ED) - [address] [place], [place] [address] [phone]

## 2024-05-09 NOTE — ED Triage Notes (Addendum)
 Pt presents with dental pain to the bottom R wisdom tooth. Pt thinks it has an abscess. Pain has been severe for the past 2 days. Pt reports she had a Tylenol #3 from her friend at 10:30 today with minimal relief.
